# Patient Record
Sex: Female | Born: 1945
Health system: Southern US, Community
[De-identification: ages and names within clinical notes are randomized; demographics above are authoritative.]

## PROBLEM LIST (undated history)

## (undated) DIAGNOSIS — I10 Essential (primary) hypertension: Secondary | ICD-10-CM

## (undated) DIAGNOSIS — M1990 Unspecified osteoarthritis, unspecified site: Secondary | ICD-10-CM

## (undated) DIAGNOSIS — K219 Gastro-esophageal reflux disease without esophagitis: Secondary | ICD-10-CM

## (undated) DIAGNOSIS — N200 Calculus of kidney: Secondary | ICD-10-CM

## (undated) DIAGNOSIS — M25511 Pain in right shoulder: Secondary | ICD-10-CM

## (undated) DIAGNOSIS — E7800 Pure hypercholesterolemia, unspecified: Secondary | ICD-10-CM

## (undated) DIAGNOSIS — R51 Headache: Secondary | ICD-10-CM

---

## 1997-12-03 ENCOUNTER — Other Ambulatory Visit: Admission: RE | Admit: 1997-12-03 | Discharge: 1997-12-03 | Payer: Self-pay | Admitting: Obstetrics & Gynecology

## 1999-10-12 ENCOUNTER — Encounter: Payer: Self-pay | Admitting: Obstetrics and Gynecology

## 1999-10-12 ENCOUNTER — Ambulatory Visit (HOSPITAL_COMMUNITY): Admission: RE | Admit: 1999-10-12 | Discharge: 1999-10-12 | Payer: Self-pay | Admitting: Obstetrics and Gynecology

## 2000-09-06 HISTORY — PX: OTHER SURGICAL HISTORY: SHX169

## 2000-12-15 ENCOUNTER — Other Ambulatory Visit: Admission: RE | Admit: 2000-12-15 | Discharge: 2000-12-15 | Payer: Self-pay | Admitting: Obstetrics and Gynecology

## 2001-12-18 ENCOUNTER — Other Ambulatory Visit: Admission: RE | Admit: 2001-12-18 | Discharge: 2001-12-18 | Payer: Self-pay | Admitting: Obstetrics and Gynecology

## 2002-03-19 ENCOUNTER — Ambulatory Visit (HOSPITAL_COMMUNITY): Admission: RE | Admit: 2002-03-19 | Discharge: 2002-03-19 | Payer: Self-pay | Admitting: *Deleted

## 2002-12-25 ENCOUNTER — Other Ambulatory Visit: Admission: RE | Admit: 2002-12-25 | Discharge: 2002-12-25 | Payer: Self-pay | Admitting: Obstetrics and Gynecology

## 2003-12-31 ENCOUNTER — Other Ambulatory Visit: Admission: RE | Admit: 2003-12-31 | Discharge: 2003-12-31 | Payer: Self-pay | Admitting: Obstetrics and Gynecology

## 2005-02-03 ENCOUNTER — Other Ambulatory Visit: Admission: RE | Admit: 2005-02-03 | Discharge: 2005-02-03 | Payer: Self-pay | Admitting: Obstetrics and Gynecology

## 2006-06-17 ENCOUNTER — Other Ambulatory Visit: Admission: RE | Admit: 2006-06-17 | Discharge: 2006-06-17 | Payer: Self-pay | Admitting: Obstetrics and Gynecology

## 2007-04-11 ENCOUNTER — Encounter (INDEPENDENT_AMBULATORY_CARE_PROVIDER_SITE_OTHER): Payer: Self-pay | Admitting: *Deleted

## 2007-04-11 ENCOUNTER — Ambulatory Visit (HOSPITAL_COMMUNITY): Admission: RE | Admit: 2007-04-11 | Discharge: 2007-04-11 | Payer: Self-pay | Admitting: *Deleted

## 2009-11-04 DIAGNOSIS — N200 Calculus of kidney: Secondary | ICD-10-CM

## 2009-11-04 HISTORY — DX: Calculus of kidney: N20.0

## 2009-11-18 ENCOUNTER — Emergency Department (HOSPITAL_COMMUNITY): Admission: EM | Admit: 2009-11-18 | Discharge: 2009-11-18 | Payer: Self-pay | Admitting: Emergency Medicine

## 2009-12-17 ENCOUNTER — Emergency Department (HOSPITAL_COMMUNITY): Admission: EM | Admit: 2009-12-17 | Discharge: 2009-12-17 | Payer: Self-pay | Admitting: Emergency Medicine

## 2010-11-25 LAB — URINALYSIS, ROUTINE W REFLEX MICROSCOPIC
Bilirubin Urine: NEGATIVE
Glucose, UA: NEGATIVE mg/dL
Ketones, ur: NEGATIVE mg/dL
Leukocytes, UA: NEGATIVE
Nitrite: NEGATIVE
Protein, ur: NEGATIVE mg/dL
Specific Gravity, Urine: 1.014 (ref 1.005–1.030)
Urobilinogen, UA: 0.2 mg/dL (ref 0.0–1.0)
pH: 7.5 (ref 5.0–8.0)

## 2010-11-25 LAB — CBC
HCT: 39.9 % (ref 36.0–46.0)
MCV: 92.7 fL (ref 78.0–100.0)
RBC: 4.3 MIL/uL (ref 3.87–5.11)
WBC: 7.9 10*3/uL (ref 4.0–10.5)

## 2010-11-25 LAB — BASIC METABOLIC PANEL
BUN: 9 mg/dL (ref 6–23)
CO2: 27 mEq/L (ref 19–32)
Calcium: 9.5 mg/dL (ref 8.4–10.5)
Chloride: 106 mEq/L (ref 96–112)
Creatinine, Ser: 1.04 mg/dL (ref 0.4–1.2)
GFR calc Af Amer: >60 mL/min (ref >60)
GFR calc non Af Amer: 54 mL/min — ABNORMAL LOW (ref >60)
Glucose, Bld: 109 mg/dL — ABNORMAL HIGH (ref 70–99)
Potassium: 3.4 mEq/L — ABNORMAL LOW (ref 3.5–5.1)
Sodium: 141 mEq/L (ref 135–145)

## 2010-11-25 LAB — DIFFERENTIAL
Eosinophils Absolute: 0.1 10*3/uL (ref 0.0–0.7)
Lymphocytes Relative: 23 % (ref 12–46)
Lymphs Abs: 1.8 10*3/uL (ref 0.7–4.0)
Monocytes Relative: 6 % (ref 3–12)
Neutrophils Relative %: 69 % (ref 43–77)

## 2010-11-25 LAB — URINE MICROSCOPIC-ADD ON

## 2010-11-30 LAB — URINALYSIS, ROUTINE W REFLEX MICROSCOPIC
Glucose, UA: NEGATIVE mg/dL
Protein, ur: NEGATIVE mg/dL
pH: 7.5 (ref 5.0–8.0)

## 2010-11-30 LAB — URINE MICROSCOPIC-ADD ON

## 2011-01-19 NOTE — Op Note (Signed)
NAMEINDIE, BOEHNE              ACCOUNT NO.:  [id number]   MEDICAL RECORD NO.:  [id number]          PATIENT TYPE:  AMB   LOCATION:  ENDO                         FACILITY:  [facility]   PHYSICIAN:  Georgiana Spinner, M.D.    DATE OF BIRTH:  December 15, 1945   DATE OF PROCEDURE:  04/11/2007  DATE OF DISCHARGE:                               OPERATIVE REPORT   PROCEDURE:  Colonoscopy.   ANESTHESIA:  Fentanyl 50 mcg, Versed 3 mg.   INDICATIONS:  Colon cancer screening.  Family history of colon polyps.   PROCEDURE:  With the patient mildly sedated in the left lateral  decubitus position, the Pentax videoscopic colonoscope was inserted in  the rectum and passed under direct vision with rolling of the patient  onto her back slightly.  We were able to reach the cecum.  This was  identified by ileocecal valve and appendiceal orifice both of which were  photographed.  From this point the colonoscope was slowly withdrawn  taking circumferential views of colonic mucosa stopping only in the  rectum which appeared normal on direct and retroflex view.  The  endoscope was straightened and withdrawn.  The patient's vital signs and  pulse oximeter remained stable.  The patient tolerated the procedure  well without apparent complications.   FINDINGS:  Negative exam.   PLAN:  Consider repeat examination in five years due to family history.           ______________________________  Georgiana Spinner, M.D.     GMO/MEDQ  D:  04/11/2007  T:  04/11/2007  Job:  [id number]

## 2011-01-19 NOTE — Op Note (Signed)
NAMEMARTRICE, [name]              ACCOUNT NO.:  [id number]   MEDICAL RECORD NO.:  [id number]          PATIENT TYPE:  AMB   LOCATION:  ENDO                         FACILITY:  [facility]   PHYSICIAN:  Georgiana Spinner, M.D.    DATE OF BIRTH:  08-07-1946   DATE OF PROCEDURE:  04/11/2007  DATE OF DISCHARGE:                               OPERATIVE REPORT   PROCEDURE:  Upper endoscopy.   INDICATIONS:  Gastroesophageal reflux disease.   ANESTHESIA:  Fentanyl 50 mcg, Versed 7 mg.   PROCEDURE:  With the patient mildly sedated in the left lateral  decubitus position the Pentax videoscopic endoscope was inserted and  passed under direct vision through the esophagus which appeared normal  into the stomach.  Fundus appeared erythematous with red stippling  changes that were photographed and biopsied.  We advanced to the body,  antrum, duodenal bulb, second portion duodenum all of which appeared  normal.  From this point the endoscope was slowly withdrawn taking  circumferential views of duodenal mucosa until the endoscope had been  pulled back in the stomach placed in retroflexion to view the stomach  from below.  The endoscope was straightened and withdrawn taking  circumferential views of the remaining gastric and esophageal mucosa.  The patient's vital signs, pulse oximeter remained stable.  The patient  tolerated procedure well without apparent complications.   FINDINGS:  Erythematous changes of the gastric fundus.  Loose wrap of  the GE junction around the endoscope indicating laxity of the lower  esophageal sphincter.  Otherwise unremarkable exam.   PLAN:  Await biopsy report.  The patient will call me for results and  follow-up with me as an outpatient.  Proceed to colonoscopy.           ______________________________  Georgiana Spinner, M.D.     GMO/MEDQ  D:  04/11/2007  T:  04/11/2007  Job:  [id number]

## 2011-01-22 NOTE — Procedures (Signed)
[facility]  Patient:    Emily, [name] Visit Number: [id number] MRN: [id number]          Service Type: END Location: ENDO Attending Physician:  Sabino Gasser Dictated by:   Sabino Gasser, M.D. Proc. Date: 03/19/02 Admit Date:  03/19/2002                             Procedure Report  PROCEDURE:  Colonoscopy.  INDICATIONS:  Colon cancer screening; family history of colon cancer.  ANESTHESIA:  Demerol 60 mg, Versed 6 mg.  DESCRIPTION OF PROCEDURE:  With the patient mildly sedated in the left lateral decubitus position, the Olympus videoscopic colonoscope was inserted in the rectum and passed under direct vision to the cecum -- identified by ileocecal valve and appendiceal orifice, both of which were photographed.  From this point the colonoscope was then slowly withdrawn, taking circumferential views of the entire colonic mucosa, stopping only in the rectum (which appeared normal in direct and retroflexed view).  The endoscope was straightened and withdrawn.  The  patients vital signs, pulse oximetry remained stable.  The patient tolerated the procedure well and with no apparent complications.  FINDINGS:  Negative colonoscopic examination to the cecum.  PLAN:  Because of family history, repeat examination in approximately five years. Dictated by:   Sabino Gasser, M.D. Attending Physician:  Sabino Gasser DD:  03/19/02 TD:  03/20/02 Job: [id number]

## 2011-09-14 ENCOUNTER — Other Ambulatory Visit: Payer: Self-pay | Admitting: Orthopedic Surgery

## 2011-10-26 NOTE — H&P (Signed)
Emily [name] DOB: 09-23-1945   Chief Complaint: Left Hip Pain  History of Present Illness The patient is a 66 year old female who comes in today for a preoperative History and Physical. The patient is scheduled for a left total hip arthroplasty to be performed by Dr. Gus Rankin. Aluisio, MD at [facility] on Monday November 15, 2011 . The patient is being followed for their left hip pain and osteoarthritis. Symptoms reported today include pain at night and stiffness. She had good days abd bad days but her hip overall has gotten worse. She is having decresaing moblity, more difficulty with shoes and socks, more painful getting in and out of car, more difficult with steps. She denies any popping or clicking but will occassional will get the sensation of it giving way. She gets morning stiffness but it will ease off with activity. She is a very active person and feels that this has slowed her down. She feels better if she keeps going because if she sits down for any length of time, the hip will stiffen up on her. Total hip arthroplasty scheduled in attempt to decrease pain and increase function. PCP: Dr. Juline Patch OB/GYN: Dr. Huel Cote  Past MedicalHistory Osteoarthritis, Hip (715.35) Osteoarthrosis NOS, pelvis/thigh (715.95). 05/07/2010 Gastroesophageal Reflux Disease Urinary Tract Infection. 1990s Kidney Stone. March 2011  Allergies SULFA. 05/12/2010   Family History Cancer. father. related to work (asbestos exposure). deceased age 21 Heart Disease. mother, sister and brother Myocardial Infarction. Mother. deceased age 50   Social History Tobacco use. Former smoker. never smoker Alcohol use. never consumed alcohol Children. 1 Current work status. retired Financial planner (Currently). no Drug/Alcohol Rehab (Previously). no Exercise. Exercises daily; does other Illicit drug use. no Living situation. live with spouse Marital  status. married Number of flights of stairs before winded. 4-5 Pain Contract. no Tobacco / smoke exposure. no Caregiver. SNF after surgery [facility]   Medication History Crestor (10MG  Tablet, Oral daily) Active. Tylenol Extra Strength (500MG  Tablet, Oral) Active. Pantoprazole Sodium (40MG  Tablet DR, Oral as needed) Active. (prn)   Past Surgical History No pertinent past surgical history   Diagnostic Studies History EKG. Normal. Oct 2012   Review of Systems General:Present- Night Sweats (Hot flashes). Not Present- Chills, Fever, Appetite Loss, Fatigue, Feeling sick, Weight Gain, Weight Loss and Memory Loss. Skin:Not Present- Hives, Itching, Rash, Skin Color Changes, Ulcer, Psoriasis, Change in Hair or Nails, Eczema and Lesions. HEENT:Not Present- Sensitivity to light, Hearing problems, Tinnitus, Nose Bleed, Headache, Double Vision, Visual Loss, Hearing Loss, Ringing in the Ears and Dentures. Neck:Not Present- Swollen Glands and Neck Mass. Respiratory:Not Present- Shortness of breath with exertion, Shortness of breath at rest, Allergies, Coughing up blood and Chronic Cough. Cardiovascular:Present- Swelling of Extremities. Not Present- Shortness of Breath, Chest Pain, Racing/skipping heartbeats, Leg Cramps, Difficulty Breathing Lying Down, Murmur, Swelling and Palpitations. Gastrointestinal:Present- Heartburn. Not Present- Bloody Stool, Abdominal Pain, Vomiting, Nausea, Constipation, Diarrhea, Difficulty Swallowing, Incontinence of Stool, Jaundice and Loss of appetitie. Female Genitourinary:Present- Nocturia and Urgency. Not Present- Blood in Urine, Urinary frequency, Weak urinary stream, Discharge, Flank Pain, Incontinence, Painful Urination, Urinary Retention and Urinating at Night. Musculoskeletal:Present- Joint Pain and Morning Stiffness. Not Present- Muscle Weakness, Muscle Pain, Joint Stiffness, Joint Swelling, Back Pain and Spasms. Neurological:Not  Present- Tingling, Numbness, Burning, Tremor, Headaches, Dizziness, Blackout spells, Paralysis, Difficulty with balance and Weakness. Psychiatric:Not Present- Anxiety, Depression, Memory Loss and Insomnia. Endocrine:Not Present- Cold Intolerance, Heat Intolerance, Excessive hunger and Excessive Thirst. Hematology:Not Present- Abnormal Bleeding,  Anemia, Blood Clots and Easy Bruising.   Vitals 10/26/2011 2:18 PM Weight: 144 lb Height: 63 in Body Surface Area: 1.7 m Body Mass Index: 25.51 kg/m Pulse: 72 (Regular) Resp.: 18 (Unlabored) BP: 124/80 (Sitting, Left Arm, Standard)  Physical Exam General Mental Status - Alert, cooperative and good historian. General Appearance- pleasant. Not in acute distress. Orientation- Oriented X3. Build & Nutrition- Well nourished and Well developed. Head and Neck Head- normocephalic, atraumatic . Neck Global Assessment- supple. no bruit auscultated on the right and no bruit auscultated on the left. Eye Pupil- Bilateral- PERRLA. Motion- Bilateral- EOMI. Chest and Lung Exam Auscultation: Breath sounds:- clear at anterior chest wall and - clear at posterior chest wall. Adventitious sounds:- No Adventitious sounds. Cardiovascular Auscultation:Rhythm- Regular rate and rhythm. Heart Sounds- S1 WNL and S2 WNL. Murmurs & Other Heart Sounds:Auscultation of the heart reveals - No Murmurs. Abdomen Palpation/Percussion:Tenderness- Abdomen is non-tender to palpation. Rigidity (guarding)- Abdomen is soft. Auscultation:Auscultation of the abdomen reveals - Bowel sounds normal. Female Genitourinary Not done, not pertinent to present illness Peripheral Vascular Upper Extremity: Palpation:- Pulses bilaterally normal. Lower Extremity: Palpation:- Pulses bilaterally normal. Neurologic Examination of related systems reveals - normal muscle strength and tone in all extremities. Neurologic evaluation reveals -  normal sensation and upper and lower extremity deep tendon reflexes intact bilaterally  Musculoskeletal Left hip flex about 100, minimal internal rotation, 20 external rotation, 20 abduction. Right hip has normal range of motion without pain. Knees have normal painless ROM.  Assessment & Plan Osteoarthritis, Hip (715.35) Left Total Hip Arthroplasty   Dimitri Ped, PA-C

## 2011-11-04 ENCOUNTER — Encounter (HOSPITAL_COMMUNITY): Payer: Self-pay | Admitting: Pharmacy Technician

## 2011-11-08 ENCOUNTER — Ambulatory Visit (HOSPITAL_COMMUNITY)
Admission: RE | Admit: 2011-11-08 | Discharge: 2011-11-08 | Disposition: A | Discharge status: Home / Self Care | Payer: Medicare Other | Source: Ambulatory Visit | Attending: Orthopedic Surgery | Admitting: Orthopedic Surgery

## 2011-11-08 ENCOUNTER — Encounter (HOSPITAL_COMMUNITY): Payer: Self-pay

## 2011-11-08 ENCOUNTER — Encounter (HOSPITAL_COMMUNITY)
Admission: RE | Admit: 2011-11-08 | Discharge: 2011-11-08 | Disposition: A | Discharge status: Home / Self Care | Payer: Medicare Other | Source: Ambulatory Visit | Attending: Orthopedic Surgery | Admitting: Orthopedic Surgery

## 2011-11-08 DIAGNOSIS — Z01818 Encounter for other preprocedural examination: Secondary | ICD-10-CM | POA: Insufficient documentation

## 2011-11-08 DIAGNOSIS — Z01812 Encounter for preprocedural laboratory examination: Secondary | ICD-10-CM | POA: Insufficient documentation

## 2011-11-08 DIAGNOSIS — M169 Osteoarthritis of hip, unspecified: Secondary | ICD-10-CM | POA: Insufficient documentation

## 2011-11-08 DIAGNOSIS — M1610 Unilateral primary osteoarthritis, unspecified hip: Secondary | ICD-10-CM | POA: Insufficient documentation

## 2011-11-08 HISTORY — DX: Calculus of kidney: N20.0

## 2011-11-08 HISTORY — DX: Headache: R51

## 2011-11-08 HISTORY — DX: Pain in right shoulder: M25.511

## 2011-11-08 HISTORY — DX: Unspecified osteoarthritis, unspecified site: M19.90

## 2011-11-08 LAB — CBC
HCT: 44 % (ref 36.0–46.0)
MCH: 31 pg (ref 26.0–34.0)
MCHC: 34.8 g/dL (ref 30.0–36.0)
MCV: 89.2 fL (ref 78.0–100.0)
Platelets: 247 10*3/uL (ref 150–400)
RDW: 12.8 % (ref 11.5–15.5)
WBC: 5.8 10*3/uL (ref 4.0–10.5)

## 2011-11-08 LAB — COMPREHENSIVE METABOLIC PANEL
AST: 18 U/L (ref 0–37)
Albumin: 4.2 g/dL (ref 3.5–5.2)
BUN: 9 mg/dL (ref 6–23)
Calcium: 10.3 mg/dL (ref 8.4–10.5)
Creatinine, Ser: 0.79 mg/dL (ref 0.50–1.10)
Total Bilirubin: 0.5 mg/dL (ref 0.3–1.2)
Total Protein: 7.7 g/dL (ref 6.0–8.3)

## 2011-11-08 LAB — SURGICAL PCR SCREEN
MRSA, PCR: NEGATIVE
Staphylococcus aureus: NEGATIVE

## 2011-11-08 LAB — PROTIME-INR
INR: 0.98 (ref 0.00–1.49)
Prothrombin Time: 13.2 seconds (ref 11.6–15.2)

## 2011-11-08 LAB — URINALYSIS, ROUTINE W REFLEX MICROSCOPIC
Glucose, UA: NEGATIVE mg/dL
Hgb urine dipstick: NEGATIVE
Protein, ur: NEGATIVE mg/dL

## 2011-11-08 LAB — URINE MICROSCOPIC-ADD ON

## 2011-11-08 NOTE — Patient Instructions (Addendum)
20 Emily [name]  11/08/2011   Your procedure is scheduled on:  11-15-2011  Report to [facility] at  1020 AM.  Call this number if you have problems the morning of surgery: [phone]   Remember:   Do not eat food or drink liquids:After Midnight.  .  Take these medicines the morning of surgery with A SIP OF WATER: pantaprazole   Do not wear jewelry or make up.  Do not wear lotions, powders, or perfumes.Do not wear deodorant.    Do not bring valuables to the hospital.  Contacts, dentures or bridgework may not be worn into surgery.  Leave suitcase in the car. After surgery it may be brought to your room.  For patients admitted to the hospital, checkout time is 11:00 AM the day of discharge.     Special Instructions: CHG Shower Use Special Wash: 1/2 bottle night before surgery and 1/2 bottle morning of surgery.neck down avoid private area, no shaving 2 days before showers   Please read over the following fact sheets that you were given: MRSA Information, blood fact sheet  Cain Sieve [facility] pre op nurse phone number [phone], call if needed

## 2011-11-08 NOTE — Pre-Procedure Instructions (Addendum)
ekg 06-23-11 dr Ricki Miller on chart Stress test dr Jacinto Halim [phone] on chart

## 2011-11-08 NOTE — Pre-Procedure Instructions (Signed)
Dr Ricki Miller medical clearance note on chart

## 2011-11-15 ENCOUNTER — Encounter (HOSPITAL_COMMUNITY): Payer: Self-pay | Admitting: Anesthesiology

## 2011-11-15 ENCOUNTER — Encounter (HOSPITAL_COMMUNITY): Payer: Self-pay | Admitting: *Deleted

## 2011-11-15 ENCOUNTER — Inpatient Hospital Stay (HOSPITAL_COMMUNITY): Payer: Medicare Other

## 2011-11-15 ENCOUNTER — Encounter (HOSPITAL_COMMUNITY)
Admission: RE | Disposition: A | Discharge status: Skilled Nursing Facility | Payer: Self-pay | Source: Ambulatory Visit | Attending: Orthopedic Surgery

## 2011-11-15 ENCOUNTER — Inpatient Hospital Stay (HOSPITAL_COMMUNITY)
Admission: RE | Admit: 2011-11-15 | Discharge: 2011-11-18 | DRG: 470 | Disposition: A | Discharge status: Skilled Nursing Facility | Payer: Medicare Other | Source: Ambulatory Visit | Attending: Orthopedic Surgery | Admitting: Orthopedic Surgery

## 2011-11-15 ENCOUNTER — Inpatient Hospital Stay (HOSPITAL_COMMUNITY): Payer: Medicare Other | Admitting: Anesthesiology

## 2011-11-15 DIAGNOSIS — M169 Osteoarthritis of hip, unspecified: Principal | ICD-10-CM | POA: Diagnosis present

## 2011-11-15 DIAGNOSIS — Z96649 Presence of unspecified artificial hip joint: Secondary | ICD-10-CM

## 2011-11-15 DIAGNOSIS — E871 Hypo-osmolality and hyponatremia: Secondary | ICD-10-CM | POA: Diagnosis not present

## 2011-11-15 DIAGNOSIS — D62 Acute posthemorrhagic anemia: Secondary | ICD-10-CM | POA: Diagnosis not present

## 2011-11-15 DIAGNOSIS — E876 Hypokalemia: Secondary | ICD-10-CM | POA: Diagnosis not present

## 2011-11-15 DIAGNOSIS — M1610 Unilateral primary osteoarthritis, unspecified hip: Principal | ICD-10-CM | POA: Diagnosis present

## 2011-11-15 HISTORY — PX: TOTAL HIP ARTHROPLASTY: SHX124

## 2011-11-15 LAB — TYPE AND SCREEN: Antibody Screen: NEGATIVE

## 2011-11-15 SURGERY — ARTHROPLASTY, HIP, TOTAL,POSTERIOR APPROACH
Anesthesia: General | Site: Hip | Laterality: Left | Wound class: Clean

## 2011-11-15 MED ORDER — SODIUM CHLORIDE 0.9 % IV SOLN
INTRAVENOUS | Status: DC | PRN
  Administered 2011-11-15: 50 mL

## 2011-11-15 MED ORDER — PROPOFOL 10 MG/ML IV BOLUS
INTRAVENOUS | Status: DC | PRN
  Administered 2011-11-15: 120 mg via INTRAVENOUS

## 2011-11-15 MED ORDER — RIVAROXABAN 10 MG PO TABS
10.0000 mg | ORAL_TABLET | Freq: Every day | ORAL | Status: DC
  Administered 2011-11-16 – 2011-11-18 (×3): 10 mg via ORAL
  Filled 2011-11-15 (×3): qty 1

## 2011-11-15 MED ORDER — ACETAMINOPHEN 10 MG/ML IV SOLN
1000.0000 mg | Freq: Once | INTRAVENOUS | Status: AC
  Administered 2011-11-15: 1000 mg via INTRAVENOUS
  Filled 2011-11-15: qty 100

## 2011-11-15 MED ORDER — BISACODYL 10 MG RE SUPP: 10.0000 mg | Freq: Every day | RECTAL | Status: DC | PRN

## 2011-11-15 MED ORDER — PHENOL 1.4 % MT LIQD: 1.0000 | OROMUCOSAL | Status: DC | PRN

## 2011-11-15 MED ORDER — MORPHINE SULFATE 2 MG/ML IJ SOLN
INTRAMUSCULAR | Status: AC
  Filled 2011-11-15: qty 1

## 2011-11-15 MED ORDER — FENTANYL CITRATE 0.05 MG/ML IJ SOLN
INTRAMUSCULAR | Status: DC | PRN
  Administered 2011-11-15 (×2): 100 ug via INTRAVENOUS
  Administered 2011-11-15 (×3): 50 ug via INTRAVENOUS

## 2011-11-15 MED ORDER — CEFAZOLIN SODIUM 1-5 GM-% IV SOLN
1.0000 g | Freq: Once | INTRAVENOUS | Status: AC
  Administered 2011-11-15: 1 g via INTRAVENOUS

## 2011-11-15 MED ORDER — METOCLOPRAMIDE HCL 10 MG PO TABS: 5.0000 mg | ORAL_TABLET | Freq: Three times a day (TID) | ORAL | Status: DC | PRN

## 2011-11-15 MED ORDER — ONDANSETRON HCL 4 MG PO TABS
4.0000 mg | ORAL_TABLET | Freq: Four times a day (QID) | ORAL | Status: DC | PRN
  Administered 2011-11-16: 4 mg via ORAL
  Filled 2011-11-15: qty 1

## 2011-11-15 MED ORDER — BUPIVACAINE LIPOSOME 1.3 % IJ SUSP
20.0000 mL | Freq: Once | INTRAMUSCULAR | Status: DC
  Filled 2011-11-15: qty 20

## 2011-11-15 MED ORDER — HYDROMORPHONE HCL PF 1 MG/ML IJ SOLN
INTRAMUSCULAR | Status: DC | PRN
  Administered 2011-11-15 (×2): 0.5 mg via INTRAVENOUS
  Administered 2011-11-15: 1 mg via INTRAVENOUS

## 2011-11-15 MED ORDER — BUPIVACAINE LIPOSOME 1.3 % IJ SUSP
INTRAMUSCULAR | Status: DC | PRN
  Administered 2011-11-15: 20 mL

## 2011-11-15 MED ORDER — HYDROMORPHONE HCL PF 1 MG/ML IJ SOLN
INTRAMUSCULAR | Status: AC
  Filled 2011-11-15: qty 1

## 2011-11-15 MED ORDER — NEOSTIGMINE METHYLSULFATE 1 MG/ML IJ SOLN
INTRAMUSCULAR | Status: DC | PRN
  Administered 2011-11-15: 4 mg via INTRAVENOUS

## 2011-11-15 MED ORDER — DEXTROSE-NACL 5-0.45 % IV SOLN
INTRAVENOUS | Status: DC
  Administered 2011-11-15: 20:00:00 via INTRAVENOUS
  Administered 2011-11-16: 75 mL/h via INTRAVENOUS

## 2011-11-15 MED ORDER — DOCUSATE SODIUM 100 MG PO CAPS
100.0000 mg | ORAL_CAPSULE | Freq: Two times a day (BID) | ORAL | Status: DC
  Administered 2011-11-15 – 2011-11-18 (×6): 100 mg via ORAL
  Filled 2011-11-15 (×6): qty 1

## 2011-11-15 MED ORDER — ACETAMINOPHEN 650 MG RE SUPP: 650.0000 mg | Freq: Four times a day (QID) | RECTAL | Status: DC | PRN

## 2011-11-15 MED ORDER — DEXTROSE 5 % IV SOLN
500.0000 mg | Freq: Four times a day (QID) | INTRAVENOUS | Status: DC | PRN
  Administered 2011-11-15: 500 mg via INTRAVENOUS
  Filled 2011-11-15: qty 5

## 2011-11-15 MED ORDER — 0.9 % SODIUM CHLORIDE (POUR BTL) OPTIME
TOPICAL | Status: DC | PRN
  Administered 2011-11-15: 1000 mL

## 2011-11-15 MED ORDER — ONDANSETRON HCL 4 MG/2ML IJ SOLN
4.0000 mg | Freq: Four times a day (QID) | INTRAMUSCULAR | Status: DC | PRN
  Administered 2011-11-15 – 2011-11-17 (×2): 4 mg via INTRAVENOUS
  Filled 2011-11-15 (×3): qty 2

## 2011-11-15 MED ORDER — ACETAMINOPHEN 325 MG PO TABS: 650.0000 mg | ORAL_TABLET | Freq: Four times a day (QID) | ORAL | Status: DC | PRN

## 2011-11-15 MED ORDER — CEFAZOLIN SODIUM 1-5 GM-% IV SOLN
1.0000 g | Freq: Four times a day (QID) | INTRAVENOUS | Status: AC
  Administered 2011-11-15 – 2011-11-16 (×3): 1 g via INTRAVENOUS
  Filled 2011-11-15 (×3): qty 50

## 2011-11-15 MED ORDER — TEMAZEPAM 15 MG PO CAPS: 15.0000 mg | ORAL_CAPSULE | Freq: Every evening | ORAL | Status: DC | PRN

## 2011-11-15 MED ORDER — FLEET ENEMA 7-19 GM/118ML RE ENEM: 1.0000 | ENEMA | Freq: Once | RECTAL | Status: AC | PRN

## 2011-11-15 MED ORDER — DIPHENHYDRAMINE HCL 12.5 MG/5ML PO ELIX: 12.5000 mg | ORAL_SOLUTION | ORAL | Status: DC | PRN

## 2011-11-15 MED ORDER — METOCLOPRAMIDE HCL 5 MG/ML IJ SOLN
5.0000 mg | Freq: Three times a day (TID) | INTRAMUSCULAR | Status: DC | PRN
  Administered 2011-11-15: 10 mg via INTRAVENOUS
  Filled 2011-11-15: qty 2

## 2011-11-15 MED ORDER — ROCURONIUM BROMIDE 100 MG/10ML IV SOLN
INTRAVENOUS | Status: DC | PRN
  Administered 2011-11-15: 10 mg via INTRAVENOUS

## 2011-11-15 MED ORDER — MIDAZOLAM HCL 5 MG/5ML IJ SOLN
INTRAMUSCULAR | Status: DC | PRN
  Administered 2011-11-15: 2 mg via INTRAVENOUS

## 2011-11-15 MED ORDER — ATORVASTATIN CALCIUM 20 MG PO TABS
20.0000 mg | ORAL_TABLET | Freq: Every day | ORAL | Status: DC
  Administered 2011-11-15 – 2011-11-17 (×2): 20 mg via ORAL
  Filled 2011-11-15 (×4): qty 1

## 2011-11-15 MED ORDER — MORPHINE SULFATE 2 MG/ML IJ SOLN
1.0000 mg | INTRAMUSCULAR | Status: DC | PRN
  Administered 2011-11-15: 1 mg via INTRAVENOUS
  Administered 2011-11-16: 2 mg via INTRAVENOUS
  Filled 2011-11-15: qty 1

## 2011-11-15 MED ORDER — ACETAMINOPHEN 10 MG/ML IV SOLN
1000.0000 mg | Freq: Four times a day (QID) | INTRAVENOUS | Status: AC
  Administered 2011-11-15 – 2011-11-16 (×4): 1000 mg via INTRAVENOUS
  Filled 2011-11-15 (×4): qty 100

## 2011-11-15 MED ORDER — DEXAMETHASONE SODIUM PHOSPHATE 10 MG/ML IJ SOLN
10.0000 mg | Freq: Once | INTRAMUSCULAR | Status: AC
  Administered 2011-11-15: 10 mg via INTRAVENOUS

## 2011-11-15 MED ORDER — SODIUM CHLORIDE 0.9 % IV SOLN: INTRAVENOUS | Status: DC

## 2011-11-15 MED ORDER — METHOCARBAMOL 500 MG PO TABS
500.0000 mg | ORAL_TABLET | Freq: Four times a day (QID) | ORAL | Status: DC | PRN
  Administered 2011-11-16 – 2011-11-18 (×5): 500 mg via ORAL
  Filled 2011-11-15 (×5): qty 1

## 2011-11-15 MED ORDER — GLYCOPYRROLATE 0.2 MG/ML IJ SOLN
INTRAMUSCULAR | Status: DC | PRN
  Administered 2011-11-15: .5 mg via INTRAVENOUS

## 2011-11-15 MED ORDER — MENTHOL 3 MG MT LOZG
1.0000 | LOZENGE | OROMUCOSAL | Status: DC | PRN
  Filled 2011-11-15: qty 9

## 2011-11-15 MED ORDER — LACTATED RINGERS IV SOLN
INTRAVENOUS | Status: DC
  Administered 2011-11-15: 1000 mL via INTRAVENOUS

## 2011-11-15 MED ORDER — HYDROMORPHONE HCL PF 1 MG/ML IJ SOLN
0.2500 mg | INTRAMUSCULAR | Status: DC | PRN
  Administered 2011-11-15 (×2): 0.25 mg via INTRAVENOUS

## 2011-11-15 MED ORDER — SUCCINYLCHOLINE CHLORIDE 20 MG/ML IJ SOLN
INTRAMUSCULAR | Status: DC | PRN
  Administered 2011-11-15: 100 mg via INTRAVENOUS

## 2011-11-15 MED ORDER — ONDANSETRON HCL 4 MG/2ML IJ SOLN
INTRAMUSCULAR | Status: DC | PRN
  Administered 2011-11-15: 4 mg via INTRAVENOUS

## 2011-11-15 MED ORDER — OXYCODONE HCL 5 MG PO TABS
5.0000 mg | ORAL_TABLET | ORAL | Status: DC | PRN
  Administered 2011-11-15: 5 mg via ORAL
  Administered 2011-11-16 – 2011-11-17 (×7): 10 mg via ORAL
  Filled 2011-11-15 (×7): qty 2
  Filled 2011-11-15: qty 1

## 2011-11-15 MED ORDER — POLYETHYLENE GLYCOL 3350 17 G PO PACK
17.0000 g | PACK | Freq: Every day | ORAL | Status: DC | PRN
  Administered 2011-11-18: 17 g via ORAL
  Filled 2011-11-15: qty 1

## 2011-11-15 MED ORDER — PANTOPRAZOLE SODIUM 40 MG PO TBEC
40.0000 mg | DELAYED_RELEASE_TABLET | Freq: Every day | ORAL | Status: DC
  Administered 2011-11-16 – 2011-11-18 (×3): 40 mg via ORAL
  Filled 2011-11-15 (×3): qty 1

## 2011-11-15 SURGICAL SUPPLY — 51 items
BAG ZIPLOCK 12X15 (MISCELLANEOUS) ×2 IMPLANT
BIT DRILL 2.8X128 (BIT) ×2 IMPLANT
BLADE EXTENDED COATED 6.5IN (ELECTRODE) ×2 IMPLANT
BLADE SAW SAG 73X25 THK (BLADE) ×1
BLADE SAW SGTL 73X25 THK (BLADE) ×1 IMPLANT
CLOTH BEACON ORANGE TIMEOUT ST (SAFETY) ×2 IMPLANT
DECANTER SPIKE VIAL GLASS SM (MISCELLANEOUS) ×2 IMPLANT
DRAPE INCISE IOBAN 66X45 STRL (DRAPES) ×2 IMPLANT
DRAPE ORTHO SPLIT 77X108 STRL (DRAPES) ×2
DRAPE POUCH INSTRU U-SHP 10X18 (DRAPES) ×2 IMPLANT
DRAPE SURG ORHT 6 SPLT 77X108 (DRAPES) ×2 IMPLANT
DRAPE U-SHAPE 47X51 STRL (DRAPES) ×2 IMPLANT
DRSG ADAPTIC 3X8 NADH LF (GAUZE/BANDAGES/DRESSINGS) ×2 IMPLANT
DRSG MEPILEX BORDER 4X4 (GAUZE/BANDAGES/DRESSINGS) ×2 IMPLANT
DRSG MEPILEX BORDER 4X8 (GAUZE/BANDAGES/DRESSINGS) ×2 IMPLANT
DRSG MEPITEL 3X4 ME34 (GAUZE/BANDAGES/DRESSINGS) ×2 IMPLANT
DURAPREP 26ML APPLICATOR (WOUND CARE) ×2 IMPLANT
ELECT REM PT RETURN 9FT ADLT (ELECTROSURGICAL) ×2
ELECTRODE REM PT RTRN 9FT ADLT (ELECTROSURGICAL) ×1 IMPLANT
EVACUATOR 1/8 PVC DRAIN (DRAIN) ×2 IMPLANT
FACESHIELD LNG OPTICON STERILE (SAFETY) ×8 IMPLANT
GAUZE SPONGE 4X4 12PLY STRL LF (GAUZE/BANDAGES/DRESSINGS) ×2 IMPLANT
GLOVE BIO SURGEON STRL SZ7.5 (GLOVE) ×2 IMPLANT
GLOVE BIO SURGEON STRL SZ8 (GLOVE) ×2 IMPLANT
GLOVE BIOGEL PI IND STRL 8 (GLOVE) ×2 IMPLANT
GLOVE BIOGEL PI INDICATOR 8 (GLOVE) ×2
GOWN STRL NON-REIN LRG LVL3 (GOWN DISPOSABLE) ×2 IMPLANT
GOWN STRL REIN XL XLG (GOWN DISPOSABLE) ×2 IMPLANT
IMMOBILIZER KNEE 20 (SOFTGOODS) ×2
IMMOBILIZER KNEE 20 THIGH 36 (SOFTGOODS) ×1 IMPLANT
KIT BASIN OR (CUSTOM PROCEDURE TRAY) ×2 IMPLANT
MANIFOLD NEPTUNE II (INSTRUMENTS) ×2 IMPLANT
NDL SAFETY ECLIPSE 18X1.5 (NEEDLE) ×1 IMPLANT
NEEDLE HYPO 18GX1.5 SHARP (NEEDLE) ×1
NS IRRIG 1000ML POUR BTL (IV SOLUTION) ×2 IMPLANT
PACK TOTAL JOINT (CUSTOM PROCEDURE TRAY) ×2 IMPLANT
PASSER SUT SWANSON 36MM LOOP (INSTRUMENTS) ×2 IMPLANT
POSITIONER SURGICAL ARM (MISCELLANEOUS) ×2 IMPLANT
SPONGE GAUZE 4X4 12PLY (GAUZE/BANDAGES/DRESSINGS) ×2 IMPLANT
STRIP CLOSURE SKIN 1/2X4 (GAUZE/BANDAGES/DRESSINGS) ×4 IMPLANT
SUT ETHIBOND NAB CT1 #1 30IN (SUTURE) ×4 IMPLANT
SUT MNCRL AB 4-0 PS2 18 (SUTURE) ×2 IMPLANT
SUT VIC AB 1 CT1 27 (SUTURE) ×3
SUT VIC AB 1 CT1 27XBRD ANTBC (SUTURE) ×3 IMPLANT
SUT VIC AB 2-0 CT1 27 (SUTURE) ×3
SUT VIC AB 2-0 CT1 TAPERPNT 27 (SUTURE) ×3 IMPLANT
SYR 50ML LL SCALE MARK (SYRINGE) ×2 IMPLANT
TOWEL OR 17X26 10 PK STRL BLUE (TOWEL DISPOSABLE) ×4 IMPLANT
TOWEL OR NON WOVEN STRL DISP B (DISPOSABLE) ×2 IMPLANT
TRAY FOLEY CATH 14FRSI W/METER (CATHETERS) ×2 IMPLANT
WATER STERILE IRR 1500ML POUR (IV SOLUTION) ×2 IMPLANT

## 2011-11-15 NOTE — Op Note (Signed)
Pre-operative diagnosis- Osteoarthritis Left hip  Post-operative diagnosis- Osteoarthritis  Left hip  Procedure-  LeftTotal Hip Arthroplasty  Surgeon- Gus Rankin. Shanicqua Coldren, MD  Assistant- Avel Peace, PA-C   Anesthesia  General  EBL- 250   Drain hemovac   Complication- None  Condition-PACU - hemodynamically stable.   Brief Clinical Note-  Emily [name] is a 66 y.o. female with end stage arthritis of her left hip with progressively worsening pain and dysfunction. Pain occurs with activity and rest including pain at night. She has tried analgesics, protected weight bearing and rest without benefit. Pain is too severe to attempt physical therapy. Radiographs demonstrate bone on bone arthritis with subchondral cyst formation. She presents now for left THA.  Procedure in detail-   The patient is brought into the operating room and placed on the operating table. After successful administration of General  anesthesia, the patient is placed in the  Right lateral decubitus position with the  Left side up and held in place with the hip positioner. The lower extremity is isolated from the perineum with plastic drapes and time-out is performed by the surgical team. The lower extremity is then prepped and draped in the usual sterile fashion. A short posterolateral incision is made with a ten blade through the subcutaneous tissue to the level of the fascia lata which is incised in line with the skin incision. The sciatic nerve is palpated and protected and the short external rotators and capsule are isolated from the femur. The hip is then dislocated and the center of the femoral head is marked. A trial prosthesis is placed such that the trial head corresponds to the center of the patients' native femoral head. The resection level is marked on the femoral neck and the resection is made with an oscillating saw. The femoral head is removed and femoral retractors placed to gain access to the femoral canal.     The canal finder is passed into the femoral canal and the canal is thoroughly irrigated with sterile saline to remove the fatty contents. Axial reaming is performed to 15.5  mm, proximal reaming to 20D  and the sleeve machined to a small. A 20D small trial sleeve is placed into the proximal femur.      The femur is then retracted anteriorly to gain acetabular exposure. Acetabular retractors are placed and the labrum and osteophytes are removed, Acetabular reaming is performed to 51  mm and a 52  mm Pinnacle acetabular shell is placed in anatomic position with excellent purchase. Additional dome screws were not needed. An apex hole eliminator is placed and the permanent 32 mm neutral + 4 Marathon liner is placed into the acetabular shell.      The trial femur is then placed into the femoral canal. The size is 20 x 15  stem with a 36 + 8  neck and a 32 + 3 head with the neck version matching  the patients' native anteversion. The hip is reduced with excellent stability with full extension and full external rotation, 70 degrees flexion with 40 degrees adduction and 90 degrees internal rotation and 90 degrees of flexion with 70 degrees of internal rotation. The operative leg is placed on top of the non-operative leg and the leg lengths are found to be equal. The trials are then removed and the permanent implant of the same size is impacted into the femoral canal. The ceramic femoral head of the same size as the trial is placed and the hip is reduced  with the same stability parameters. The operative leg is again placed on top of the non-operative leg and the leg lengths are found to be equal.      The wound is then copiously irrigated with saline solution and the capsule and short external rotators are re-attached to the femur through drill holes with Ethibond suture. The fascia lata is closed over a hemovac drain with #1 vicryl suture and the fascia lata, gluteal muscles and subcutaneous tissues are injected with  Exparel 20ml diluted with saline 50ml. The subcutaneous tissues are closed with #1 and2-0 vicryl and the subcuticular layer closed with running 4-0 Monocryl. The drain is hooked to suction, incision cleaned and dried, and steri-srips and a bulky sterile dressing applied. The limb is placed into a knee immobilizer and the patient is awakened and transported to recovery in stable condition.      Please note that a surgical assistant was a medical necessity for this procedure in order to perform it in a safe and expeditious manner. The assistant was necessary to provide retraction to the vital neurovascular structures and to retract and position the limb to allow for anatomic placement of the prosthetic components.  Gus Rankin Emily Hungate, MD    11/15/2011, 2:13 PM

## 2011-11-15 NOTE — Anesthesia Preprocedure Evaluation (Addendum)
Anesthesia Evaluation  Patient identified by MRN, date of birth, ID band Patient awake    Reviewed: Allergy & Precautions, H&P , NPO status , Patient's Chart, lab work & pertinent test results, reviewed documented beta blocker date and time   Airway Mallampati: II      Dental  (+) Teeth Intact and Dental Advisory Given   Pulmonary neg pulmonary ROS,  breath sounds clear to auscultation        Cardiovascular negative cardio ROS  Rhythm:Regular Rate:Normal  Denies cardiac symptoms Given CV clearance   Neuro/Psych negative neurological ROS  negative psych ROS   GI/Hepatic negative GI ROS, Neg liver ROS,   Endo/Other  negative endocrine ROS  Renal/GU negative Renal ROS  negative genitourinary   Musculoskeletal   Abdominal   Peds negative pediatric ROS (+)  Hematology negative hematology ROS (+)   Anesthesia Other Findings   Reproductive/Obstetrics negative OB ROS                          Anesthesia Physical Anesthesia Plan  ASA: I  Anesthesia Plan: General   Post-op Pain Management:    Induction: Intravenous  Airway Management Planned: Oral ETT  Additional Equipment:   Intra-op Plan:   Post-operative Plan: Extubation in OR  Informed Consent: I have reviewed the patients History and Physical, chart, labs and discussed the procedure including the risks, benefits and alternatives for the proposed anesthesia with the patient or authorized representative who has indicated his/her understanding and acceptance.   Dental advisory given  Plan Discussed with: CRNA and Surgeon  Anesthesia Plan Comments:         Anesthesia Quick Evaluation

## 2011-11-15 NOTE — Preoperative (Signed)
Beta Blockers   Reason not to administer Beta Blockers:Not Applicable 

## 2011-11-15 NOTE — Interval H&P Note (Signed)
History and Physical Interval Note:  11/15/2011 12:42 PM  Emily [name]  has presented today for surgery, with the diagnosis of osteoarthritis left hip  The various methods of treatment have been discussed with the patient and family. After consideration of risks, benefits and other options for treatment, the patient has consented to  Procedure(s) (LRB): TOTAL HIP ARTHROPLASTY (Left) as a surgical intervention .  The patients' history has been reviewed, patient examined, no change in status, stable for surgery.  I have reviewed the patients' chart and labs.  Questions were answered to the patient's satisfaction.     Loanne Drilling

## 2011-11-15 NOTE — Transfer of Care (Signed)
Immediate Anesthesia Transfer of Care Note  Patient: Emily [name]  Procedure(s) Performed: Procedure(s) (LRB): TOTAL HIP ARTHROPLASTY (Left)  Patient Location: PACU  Anesthesia Type: General  Level of Consciousness: sedated, patient cooperative and responds to stimulaton  Airway & Oxygen Therapy: Patient Spontanous Breathing and Patient connected to face mask oxgen  Post-op Assessment: Report given to PACU RN and Post -op Vital signs reviewed and stable  Post vital signs: Reviewed and stable  Complications: No apparent anesthesia complications

## 2011-11-15 NOTE — Anesthesia Postprocedure Evaluation (Signed)
  Anesthesia Post-op Note  Patient: Emily [name]  Procedure(s) Performed: Procedure(s) (LRB): TOTAL HIP ARTHROPLASTY (Left)  Patient Location: PACU  Anesthesia Type: General  Level of Consciousness: oriented and sedated  Airway and Oxygen Therapy: Patient Spontanous Breathing and Patient connected to nasal cannula oxygen  Post-op Pain: mild  Post-op Assessment: Post-op Vital signs reviewed, Patient's Cardiovascular Status Stable, Respiratory Function Stable and Patent Airway  Post-op Vital Signs: stable  Complications: No apparent anesthesia complications

## 2011-11-16 LAB — CBC
Hemoglobin: 11.6 g/dL — ABNORMAL LOW (ref 12.0–15.0)
Platelets: 210 10*3/uL (ref 150–400)
RBC: 3.84 MIL/uL — ABNORMAL LOW (ref 3.87–5.11)

## 2011-11-16 LAB — BASIC METABOLIC PANEL
CO2: 24 mEq/L (ref 19–32)
GFR calc non Af Amer: >90 mL/min (ref >90)
Glucose, Bld: 152 mg/dL — ABNORMAL HIGH (ref 70–99)
Potassium: 3.6 mEq/L (ref 3.5–5.1)
Sodium: 134 mEq/L — ABNORMAL LOW (ref 135–145)

## 2011-11-16 NOTE — Progress Notes (Signed)
CSW consulted to assist with d/c planning. Met with pt today to assist with SNF placement. Pt has made plans to have ST rehab at [facility] following hospitalization. Plan confirmed with SNF. FL2 in shadow chart for MD signature. CSW will follow to assist with D/C planning to SNF.

## 2011-11-16 NOTE — Progress Notes (Signed)
Subjective: 1 Day Post-Op Procedure(s) (LRB): TOTAL HIP ARTHROPLASTY (Left) Patient reports pain as mild.   Patient seen in rounds with Dr. Lequita Halt. Patient doing well. We will start therapy today. Plan is to go [facility] after hospital stay.  Objective: Vital signs in last 24 hours: Temp:  [96.1 F (35.6 C)-98.2 F (36.8 C)] 98.2 F (36.8 C) (03/12 0538) Pulse Rate:  [63-86] 77  (03/12 0538) Resp:  [10-20] 16  (03/12 0538) BP: (114-160)/(61-99) 114/77 mmHg (03/12 0538) SpO2:  [96 %-100 %] 98 % (03/12 0538) Weight:  [64.411 kg (142 lb)] 64.411 kg (142 lb) (03/11 1723)  Intake/Output from previous day:  Intake/Output Summary (Last 24 hours) at 11/16/11 0902 Last data filed at 11/16/11 0844  Gross per 24 hour  Intake 4862.92 ml  Output   2460 ml  Net 2402.92 ml    Intake/Output this shift: UOP 1050  Labs:  [facility] 11/16/11 0431  HGB 11.6*    Basename 11/16/11 0431  WBC 11.7*  RBC 3.84*  HCT 33.6*  PLT 210    Basename 11/16/11 0431  NA 134*  K 3.6  CL 100  CO2 24  BUN 7  CREATININE 0.62  GLUCOSE 152*  CALCIUM 8.8   No results found for this basename: LABPT:2,INR:2 in the last 72 hours  Exam - Neurovascular intact Sensation intact distally Dressing - clean, dry, no drainage Motor function intact - moving foot and toes well on exam.  Hemovac pulled without difficulty.  Past Medical History  Diagnosis Date  . Headache   . Arthritis   . Kidney stone 12/09/2009   passed on own  . Right shoulder pain     at night    Assessment/Plan: 1 Day Post-Op Procedure(s) (LRB): TOTAL HIP ARTHROPLASTY (Left) Principal Problem:  *Osteoarthritis of hip   Advance diet Up with therapy Continue foley due to strict I&O and urinary output monitoring Discharge to SNF  DVT Prophylaxis - Xarelto  Protocol Partial-Weight Bearing 25-50% left Leg D/C Knee Immobilizer Hemovac Pulled Begin Therapy Hip Preacutions Keep foley until tomorrow. No  vaccines.  Khalfani Weideman 11/16/2011, 9:02 AM

## 2011-11-16 NOTE — Progress Notes (Signed)
Physical Therapy Treatment Patient Details Name: Emily [name] MRN: [id number] DOB: 16-May-1946 Today's Date: 11/16/2011 1425-1455 PT Assessment/Plan  PT - Assessment/Plan Comments on Treatment Session: pt progressing w/ mobility, c/o soreness, no real pain of L hip PT Plan: Discharge plan remains appropriate PT Frequency: 7X/week Recommendations for Other Services: OT consult Follow Up Recommendations: Skilled nursing facility Equipment Recommended: None recommended by PT PT Goals  Acute Rehab PT Goals PT Goal Formulation: With patient Time For Goal Achievement: 7 days Pt will go Supine/Side to Sit: with supervision PT Goal: Supine/Side to Sit - Progress: Goal set today Pt will go Sit to Supine/Side: with supervision PT Goal: Sit to Supine/Side - Progress: Progressing toward goal Pt will go Sit to Stand: with supervision PT Goal: Sit to Stand - Progress: Progressing toward goal Pt will go Stand to Sit: with supervision PT Goal: Stand to Sit - Progress: Progressing toward goal Pt will Stand: with supervision PT Goal: Stand - Progress: Progressing toward goal Pt will Ambulate: 51 - 150 feet;with supervision;with rolling walker PT Goal: Ambulate - Progress: Progressing toward goal Pt will Perform Home Exercise Program: with supervision, verbal cues required/provided PT Goal: Perform Home Exercise Program - Progress: Goal set today Additional Goals Additional Goal #1: demo post. THA precautions PT Goal: Additional Goal #1 - Progress: Progressing toward goal  PT Treatment Precautions/Restrictions  Precautions Precautions: Posterior Hip Restrictions Weight Bearing Restrictions: Yes LLE Weight Bearing: Partial weight bearing LLE Partial Weight Bearing Percentage or Pounds: 25-50 Mobility (including Balance) Bed Mobility Bed Mobility: Yes  Sit to Supine: 3: Mod assist;With rail;HOB flat, assist LLE onto bed, vc on technique Transfers Transfers: Yes Sit to Stand: 4: Min  assist;From chair/3-in-1;With armrests Sit to Stand Details (indicate cue type and reason): vc push up from chair. Stand to Sit: To bed;4: Min assist;With upper extremity assist Stand to Sit Details: vc for reaching back Ambulation/Gait Ambulation/Gait: Yes Ambulation/Gait Assistance: 4: Min assist Ambulation/Gait Assistance Details (indicate cue type and reason): vc for sequence Ambulation Distance (Feet): 25 Feet Assistive device: Rolling walker Gait Pattern: Step-to pattern    Exercise   End of Session PT - End of Session Activity Tolerance: Patient tolerated treatment well Patient left: in bed;with call bell in reach Nurse Communication: Mobility status for transfers General Behavior During Session: [facility] for tasks performed Cognition: [facility] for tasks performed  Rada Hay 11/16/2011, 4:37 PM

## 2011-11-16 NOTE — Progress Notes (Signed)
CARE MANAGEMENT NOTE 11/16/2011  Patient:  Emily [name], Emily [name]   Account Number:  [id number]  Date Initiated:  11/16/2011  Documentation initiated by:  Colleen Can  Subjective/Objective Assessment:   dx osteoarthritis left hip: total hip arthroplasty     Action/Plan:   CM spoke with patient. Pt states she wishes to go to SNF rehab upon discharge from hospital   Anticipated DC Date:  11/18/2011   Anticipated DC Plan:  SKILLED NURSING FACILITY  In-house referral  Clinical Social Worker      DC Planning Services  CM consult      [facility] Choice  NA   Choice offered to / List presented to:  NA   DME arranged  NA      DME agency  NA     HH arranged  NA      HH agency  NA   Status of service:  Completed, signed off Medicare Important Message given?  NA - LOS <3 / Initial given by admissions (If response is "NO", the following Medicare IM given date fields will be blank)

## 2011-11-16 NOTE — Plan of Care (Signed)
Problem: Consults Goal: Diagnosis- Total Joint Replacement Outcome: Progressing Primary Total Hip     

## 2011-11-16 NOTE — Evaluation (Signed)
Physical Therapy Evaluation Patient Details Name: Emily [name] MRN: [id number] DOB: [birth year] Today's Date: 11/16/2011  Problem List:  Patient Active Problem List  Diagnoses  . Osteoarthritis of hip    Past Medical History:  Past Medical History  Diagnosis Date  . Headache   . Arthritis   . Kidney stone 12-05-09   passed on own  . Right shoulder pain     at night   Past Surgical History:  Past Surgical History  Procedure Date  . Colonscopy 2002    PT Assessment/Plan/Recommendation PT Assessment Clinical Impression Statement: pt is s/p LTHA will benefit from PT to improve w/ rom, strength, mobility to dc to snf. PT Recommendation/Assessment: Patient will need skilled PT in the acute care venue PT Problem List: Decreased strength;Decreased range of motion;Decreased activity tolerance;Decreased mobility;Decreased knowledge of use of DME;Decreased safety awareness;Decreased knowledge of precautions;Pain PT Therapy Diagnosis : Difficulty walking;Acute pain PT Plan PT Frequency: 7X/week PT Treatment/Interventions: DME instruction;Gait training;Functional mobility training;Therapeutic activities;Patient/family education PT Recommendation Recommendations for Other Services: OT consult Follow Up Recommendations: Skilled nursing facility Equipment Recommended: None recommended by PT PT Goals  Acute Rehab PT Goals PT Goal Formulation: With patient Time For Goal Achievement: 7 days Pt will go Supine/Side to Sit: with supervision PT Goal: Supine/Side to Sit - Progress: Goal set today Pt will go Sit to Supine/Side: with supervision PT Goal: Sit to Supine/Side - Progress: Goal set today Pt will go Sit to Stand: with supervision PT Goal: Sit to Stand - Progress: Goal set today Pt will go Stand to Sit: with supervision PT Goal: Stand to Sit - Progress: Goal set today Pt will Stand: with supervision PT Goal: Stand - Progress: Goal set today Pt will Ambulate: 51 - 150  feet;with supervision;with rolling walker PT Goal: Ambulate - Progress: Goal set today Pt will Perform Home Exercise Program: with supervision, verbal cues required/provided PT Goal: Perform Home Exercise Program - Progress: Goal set today Additional Goals Additional Goal #1: demo post. THA precautions  PT Evaluation Precautions/Restrictions  Precautions Precautions: Posterior Hip Restrictions Weight Bearing Restrictions: Yes LLE Weight Bearing: Partial weight bearing LLE Partial Weight Bearing Percentage or Pounds: 25-50 Prior Functioning  Home Living Lives With: Alone Home Adaptive Equipment: None Prior Function Level of Independence: Independent with basic ADLs Driving: Yes Cognition Cognition Arousal/Alertness: Awake/alert Overall Cognitive Status: Appears within functional limits for tasks assessed Sensation/Coordination   Extremity Assessment RLE Assessment RLE Assessment: Within Functional Limits LLE Assessment LLE Assessment: Exceptions to WFL LLE AROM (degrees) LLE Overall AROM Comments: hip flex to 80 LLE Strength LLE Overall Strength Comments: hip flex with min assist Mobility (including Balance) Bed Mobility Bed Mobility: Yes Supine to Sit: 3: Mod assist;HOB elevated (Comment degrees);With rails Transfers Transfers: Yes Sit to Stand: 3: Mod assist;From elevated surface;From bed Sit to Stand Details (indicate cue type and reason): vc to push from bed and RW. Stand to Sit: 4: Min assist;With upper extremity assist;To chair/3-in-1 Stand to Sit Details: vc for reaching back Ambulation/Gait Ambulation/Gait: Yes Ambulation/Gait Assistance: 1: +2 Total assist Ambulation/Gait Assistance Details (indicate cue type and reason): vc for sequence/ PWB +1 for safety Ambulation Distance (Feet): 20 Feet Assistive device: Rolling walker Gait Pattern: Step-to pattern    Exercise  Total Joint Exercises Ankle Circles/Pumps: AROM;Both;15 reps Quad Sets: AROM;Left;10  reps;Supine Short Arc Quad: AROM;Left;10 reps;Supine Heel Slides: AAROM;Left;10 reps;Supine Hip ABduction/ADduction: AAROM;Left;10 reps;Supine End of Session PT - End of Session Activity Tolerance: Patient tolerated treatment well Patient left: in  chair;with call bell in reach;with family/visitor present Nurse Communication: Mobility status for transfers General Behavior During Session: [facility] for tasks performed Cognition: [facility] for tasks performed  Rada Hay 11/16/2011, 4:10 PM  [phone]

## 2011-11-16 NOTE — Progress Notes (Signed)
Utilization review completed.  

## 2011-11-17 LAB — BASIC METABOLIC PANEL
CO2: 27 mEq/L (ref 19–32)
Calcium: 9.1 mg/dL (ref 8.4–10.5)
Chloride: 97 mEq/L (ref 96–112)
Glucose, Bld: 118 mg/dL — ABNORMAL HIGH (ref 70–99)
Sodium: 130 mEq/L — ABNORMAL LOW (ref 135–145)

## 2011-11-17 LAB — CBC
Hemoglobin: 11 g/dL — ABNORMAL LOW (ref 12.0–15.0)
MCH: 30.8 pg (ref 26.0–34.0)
RBC: 3.57 MIL/uL — ABNORMAL LOW (ref 3.87–5.11)
WBC: 12.4 10*3/uL — ABNORMAL HIGH (ref 4.0–10.5)

## 2011-11-17 MED ORDER — TRAMADOL HCL 50 MG PO TABS: 50.0000 mg | ORAL_TABLET | Freq: Four times a day (QID) | ORAL | Status: DC | PRN

## 2011-11-17 MED ORDER — HYDROCODONE-ACETAMINOPHEN 5-325 MG PO TABS
1.0000 | ORAL_TABLET | ORAL | Status: DC | PRN
  Administered 2011-11-17: 1 via ORAL
  Administered 2011-11-18 (×2): 2 via ORAL
  Administered 2011-11-18: 1 via ORAL
  Filled 2011-11-17 (×2): qty 2
  Filled 2011-11-17 (×2): qty 1

## 2011-11-17 MED ORDER — POTASSIUM CHLORIDE CRYS ER 20 MEQ PO TBCR
40.0000 meq | EXTENDED_RELEASE_TABLET | Freq: Three times a day (TID) | ORAL | Status: AC
  Administered 2011-11-17 (×3): 40 meq via ORAL
  Filled 2011-11-17 (×3): qty 2

## 2011-11-17 NOTE — Evaluation (Signed)
Occupational Therapy Evaluation Patient Details Name: Emily [name] MRN: [id number] DOB: [birth year] Today's Date: 11/17/2011  Problem List:  Patient Active Problem List  Diagnoses  . Osteoarthritis of hip    Past Medical History:  Past Medical History  Diagnosis Date  . Headache   . Arthritis   . Kidney stone Dec 16, 2009   passed on own  . Right shoulder pain     at night   Past Surgical History:  Past Surgical History  Procedure Date  . Colonscopy 2002    OT Assessment/Plan/Recommendation OT Assessment Clinical Impression Statement: This 66 year old female underwent R THA with PWB and posterior THPS.  She was independent with ADLs prior to admission and is appropriate for skilled OT in acute. She was limited by nausea at time of eval.  Goals are min guard level in acute OT Recommendation/Assessment: Patient will need skilled OT in the acute care venue OT Problem List: Decreased strength;Decreased activity tolerance;Decreased knowledge of use of DME or AE;Decreased knowledge of precautions Barriers to Discharge: Decreased caregiver support OT Therapy Diagnosis : Generalized weakness OT Plan OT Frequency: Min 1X/week OT Treatment/Interventions: Self-care/ADL training;DME and/or AE instruction;Patient/family education OT Recommendation Follow Up Recommendations: Skilled nursing facility Equipment Recommended: Defer to next venue Individuals Consulted Consulted and Agree with Results and Recommendations: Patient OT Goals Acute Rehab OT Goals OT Goal Formulation: With patient Time For Goal Achievement: 7 days ADL Goals Pt Will Perform Grooming: with supervision;Standing at sink ADL Goal: Grooming - Progress: Goal set today Pt Will Perform Lower Body Bathing: with min assist;Sit to stand from chair;with adaptive equipment (min guard without cues for thps) ADL Goal: Lower Body Bathing - Progress: Goal set today Pt Will Perform Lower Body Dressing: with min assist;Sit to  stand from chair;with adaptive equipment (min guard without cues for thps) ADL Goal: Lower Body Dressing - Progress: Goal set today Pt Will Transfer to Toilet: with min assist;Ambulation;3-in-1;Maintaining weight bearing status;Maintaining hip precautions (min guard) ADL Goal: Toilet Transfer - Progress: Goal set today Pt Will Perform Toileting - Clothing Manipulation: with min assist;Standing (min guard) ADL Goal: Toileting - Clothing Manipulation - Progress: Goal set today Pt Will Perform Toileting - Hygiene: with min assist;Standing at 3-in-1/toilet (min guard) ADL Goal: Toileting - Hygiene - Progress: Goal set today  OT Evaluation Precautions/Restrictions  Precautions Precautions: Posterior Hip Restrictions Weight Bearing Restrictions: Yes LLE Weight Bearing: Partial weight bearing LLE Partial Weight Bearing Percentage or Pounds: 25-50 Prior Functioning Home Living Additional Comments: has purchased AE kit Prior Function Level of Independence: Independent with basic ADLs ADL ADL Grooming: Simulated;Set up Grooming Details (indicate cue type and reason): pt lightheaded and nauseaus with standing Where Assessed - Grooming: Sitting, chair;Supported Upper Body Bathing: Simulated;Set up Where Assessed - Upper Body Bathing: Sitting, chair;Supported Lower Body Bathing: Not assessed (has AE and has practiced:  LB NT due to nausea) Upper Body Dressing: Simulated;Set up Where Assessed - Upper Body Dressing: Sitting, chair;Supported Lower [employer] Dressing: Not assessed Toilet Transfer: Performed;Minimal Dentist Details (indicate cue type and reason): min vcs for technique Toilet Transfer Method: Stand pivot Acupuncturist: Bedside commode Toileting - Clothing Manipulation: Performed;Moderate assistance;Other (comment) (pants tight) Where Assessed - Toileting Clothing Manipulation: Standing Toileting - Hygiene: Performed;Minimal assistance Where Assessed -  Toileting Hygiene: Standing Equipment Used: Rolling walker Ambulation Related to ADLs: unable:  sitting BP 409/81--XBJYN not get in standing ADL Comments: has AE kit.  Session limited by nausea Vision/Perception  Vision - History Baseline  Vision: No visual deficits Cognition Cognition Overall Cognitive Status: Appears within functional limits for tasks assessed Orientation Level: Oriented X4 Sensation/Coordination   Extremity Assessment RUE Assessment RUE Assessment: Within Functional Limits LUE Assessment LUE Assessment: Within Functional Limits Mobility  Transfers Sit to Stand: 4: Min assist;From chair/3-in-1;With armrests Sit to Stand Details (indicate cue type and reason): min cues for hand placement Exercises   End of Session OT - End of Session Activity Tolerance: Other (comment) (limited by nausea) Patient left: in chair;with call bell in reach;with family/visitor present Nurse Communication: Other (comment) (nausea) General Behavior During Session: [facility] for tasks performed Cognition: [facility] for tasks performed [facility], OTR/L [phone] 11/17/2011  Emily [name] 11/17/2011, 12:01 PM

## 2011-11-17 NOTE — Progress Notes (Signed)
Subjective: 2 Days Post-Op Procedure(s) (LRB): TOTAL HIP ARTHROPLASTY (Left) Patient reports pain as mild.   Patient seen in rounds with Dr. Lequita Halt. Patient is doing OK.  Continue therapy.  Objective: Vital signs in last 24 hours: Temp:  [97.7 F (36.5 C)-98.8 F (37.1 C)] 98.8 F (37.1 C) (03/13 0500) Pulse Rate:  [69-86] 86  (03/13 0500) Resp:  [16] 16  (03/13 0500) BP: (132-147)/(70-80) 134/74 mmHg (03/13 0500) SpO2:  [95 %-100 %] 95 % (03/13 0500)  Intake/Output from previous day:  Intake/Output Summary (Last 24 hours) at 11/17/11 0818 Last data filed at 11/17/11 0640  Gross per 24 hour  Intake   2068 ml  Output   2035 ml  Net     33 ml    Intake/Output this shift:    Labs:  Basename 11/17/11 0424 11/16/11 0431  HGB 11.0* 11.6*    Basename 11/17/11 0424 11/16/11 0431  WBC 12.4* 11.7*  RBC 3.57* 3.84*  HCT 31.4* 33.6*  PLT 196 210    Basename 11/17/11 0424 11/16/11 0431  NA 130* 134*  K 3.1* 3.6  CL 97 100  CO2 27 24  BUN 7 7  CREATININE 0.69 0.62  GLUCOSE 118* 152*  CALCIUM 9.1 8.8   No results found for this basename: LABPT:2,INR:2 in the last 72 hours  Exam - Neurovascular intact Sensation intact distally Dressing/Incision - clean, dry, no drainage Motor function intact - moving foot and toes well on exam.   Past Medical History  Diagnosis Date  . Headache   . Arthritis   . Kidney stone 12-21-09   passed on own  . Right shoulder pain     at night    Assessment/Plan: 2 Days Post-Op Procedure(s) (LRB): TOTAL HIP ARTHROPLASTY (Left) Principal Problem:  *Osteoarthritis of hip   Advance diet Up with therapy  DVT Prophylaxis - Xarelto  Protocol Partial-Weight Bearing 25-50% left Leg  Jayden Rudge 11/17/2011, 8:18 AM

## 2011-11-17 NOTE — Progress Notes (Signed)
Physical Therapy Treatment Patient Details Name: Emily [name] MRN: [id number] DOB: 1945-10-13 Today's Date: 11/17/2011 1130-1145 PT 1145Assessment/Plan  PT - Assessment/Plan Comments on Treatment Session: pt w/ nausea after working w/ PT. rn gave IV nausea meds. pt to amb later. PT Plan: Discharge plan remains appropriate PT Frequency: 7X/week Equipment Recommended: Defer to next venue PT Goals  Acute Rehab PT Goals Pt will go Sit to Stand: with supervision PT Goal: Sit to Stand - Progress: Progressing toward goal Pt will go Stand to Sit: with supervision PT Goal: Stand to Sit - Progress: Progressing toward goal  PT Treatment Precautions/Restrictions  Precautions Precautions: Posterior Hip Restrictions Weight Bearing Restrictions: Yes LLE Weight Bearing: Partial weight bearing LLE Partial Weight Bearing Percentage or Pounds: 25-50 Mobility (including Balance) Transfers Sit to Stand: 4: Min assist;From chair/3-in-1;With armrests Sit to Stand Details (indicate cue type and reason): min cues for hand placement Stand to Sit: 4: Min assist;To chair/3-in-1;With upper extremity assist Stand to Sit Details: vc for reaching back Ambulation/Gait Ambulation/Gait: No Ambulation/Gait Assistance: 4: Min assist Ambulation/Gait Assistance Details (indicate cue type and reason): pt felt nausea and did not want to amb at this time. Assistive device: Rolling walker    Exercise    End of Session PT - End of Session Activity Tolerance:  (limited by nausea) Patient left: in chair Nurse Communication:  (nausea) General Behavior During Session: [facility] for tasks performed Cognition: [facility] for tasks performed  Rada Hay 11/17/2011, 3:33 PM

## 2011-11-17 NOTE — Progress Notes (Signed)
Physical Therapy Treatment Patient Details Name: Emily [name] MRN: [id number] DOB: 1946/03/30 Today's Date: 11/17/2011 1410-1435 PT Assessment/Plan  PT - Assessment/Plan Comments on Treatment Session: pt feeling much better PT Plan: Discharge plan remains appropriate PT Frequency: 7X/week Equipment Recommended: Defer to next venue PT Goals  Acute Rehab PT Goals Pt will go Sit to Stand: with supervision PT Goal: Sit to Stand - Progress: Progressing toward goal Pt will go Stand to Sit: with supervision PT Goal: Stand to Sit - Progress: Progressing toward goal Pt will Stand: with supervision PT Goal: Stand - Progress: Progressing toward goal Pt will Ambulate: 51 - 150 feet;with supervision;with rolling walker PT Goal: Ambulate - Progress: Progressing toward goal Pt will Perform Home Exercise Program: with supervision, verbal cues required/provided PT Goal: Perform Home Exercise Program - Progress: Progressing toward goal Additional Goals Additional Goal #1: demo post. THA precautions PT Goal: Additional Goal #1 - Progress: Progressing toward goal  PT Treatment Precautions/Restrictions  Precautions Precautions: Posterior Hip Restrictions Weight Bearing Restrictions: Yes LLE Weight Bearing: Partial weight bearing LLE Partial Weight Bearing Percentage or Pounds: 25-50 Mobility (including Balance) Transfers Sit to Stand: 4: Min assist;From chair/3-in-1;With armrests Sit to Stand Details (indicate cue type and reason): min cues for hand placement Stand to Sit: 5: Supervision;With upper extremity assist;To chair/3-in-1 Stand to Sit Details: vc for LLE placed forward. Ambulation/Gait Ambulation/Gait: No Ambulation/Gait Assistance: 4: Min assist Ambulation/Gait Assistance Details (indicate cue type and reason): vc for sequence,PWB, ER of LLE. Ambulation Distance (Feet): 50 Feet (20 ft fiirst) Assistive device: Rolling walker Gait Pattern: Step-to pattern Gait velocity: slow    Exercise  Total Joint Exercises Ankle Circles/Pumps: AROM;Both;15 reps Quad Sets: AROM;Left;10 reps;Supine Heel Slides: AAROM;Left;10 reps;Supine Hip ABduction/ADduction: AAROM;Left;10 reps;Supine Long Arc Quad: AROM;Left;10 reps;Seated End of Session PT - End of Session Activity Tolerance: Patient tolerated treatment well Patient left: in chair Nurse Communication: Mobility status for transfers General Behavior During Session: [facility] for tasks performed Cognition: [facility] for tasks performed  Rada Hay 11/17/2011, 3:39 PM

## 2011-11-17 NOTE — Progress Notes (Signed)
I ambulated patient to bathroom twice this shift. No denies pain while ambulating but does c/o of some soreness to left hip after activity. She does not want pain medication at this time, she states she okay. She was repositioned in her chair and and ice pack applied to hip.

## 2011-11-18 DIAGNOSIS — E876 Hypokalemia: Secondary | ICD-10-CM | POA: Diagnosis not present

## 2011-11-18 DIAGNOSIS — E871 Hypo-osmolality and hyponatremia: Secondary | ICD-10-CM | POA: Diagnosis not present

## 2011-11-18 DIAGNOSIS — D62 Acute posthemorrhagic anemia: Secondary | ICD-10-CM | POA: Diagnosis not present

## 2011-11-18 LAB — CBC
HCT: 30.2 % — ABNORMAL LOW (ref 36.0–46.0)
Hemoglobin: 10.5 g/dL — ABNORMAL LOW (ref 12.0–15.0)
MCH: 30.5 pg (ref 26.0–34.0)
MCV: 87.8 fL (ref 78.0–100.0)
Platelets: 201 10*3/uL (ref 150–400)
RBC: 3.44 MIL/uL — ABNORMAL LOW (ref 3.87–5.11)
WBC: 11.2 10*3/uL — ABNORMAL HIGH (ref 4.0–10.5)

## 2011-11-18 LAB — BASIC METABOLIC PANEL
Calcium: 9.3 mg/dL (ref 8.4–10.5)
Creatinine, Ser: 0.75 mg/dL (ref 0.50–1.10)
GFR calc Af Amer: >90 mL/min (ref >90)
GFR calc non Af Amer: 87 mL/min — ABNORMAL LOW (ref >90)
Sodium: 136 mEq/L (ref 135–145)

## 2011-11-18 MED ORDER — HYDROCODONE-ACETAMINOPHEN 5-325 MG PO TABS: 1.0000 | ORAL_TABLET | ORAL | Status: AC | PRN

## 2011-11-18 MED ORDER — DSS 100 MG PO CAPS: 100.0000 mg | ORAL_CAPSULE | Freq: Two times a day (BID) | ORAL | Status: AC

## 2011-11-18 MED ORDER — RIVAROXABAN 10 MG PO TABS: 10.0000 mg | ORAL_TABLET | Freq: Every day | ORAL | Status: DC

## 2011-11-18 MED ORDER — POLYETHYLENE GLYCOL 3350 17 G PO PACK: 17.0000 g | PACK | Freq: Every day | ORAL | Status: AC | PRN

## 2011-11-18 MED ORDER — ACETAMINOPHEN 325 MG PO TABS: 650.0000 mg | ORAL_TABLET | Freq: Four times a day (QID) | ORAL | Status: AC | PRN

## 2011-11-18 MED ORDER — METHOCARBAMOL 500 MG PO TABS: 500.0000 mg | ORAL_TABLET | Freq: Four times a day (QID) | ORAL | Status: AC | PRN

## 2011-11-18 NOTE — Discharge Summary (Signed)
Physician Discharge Summary   Patient ID: Emily [name] MRN: [id number] DOB/AGE: 1945/09/19 66 y.o.  Admit date: 11/15/2011 Discharge date: 11/18/2011  Primary Diagnosis: Osteoarthritis Left Hip  Admission Diagnoses: Past Medical History  Diagnosis Date  . Headache   . Arthritis   . Kidney stone 12-20-2009   passed on own  . Right shoulder pain     at night    Discharge Diagnoses:  Principal Problem:  *Osteoarthritis of hip Active Problems:  Postop Hyponatremia  Postop Hypokalemia  Postop Acute blood loss anemia   Procedure: Procedure(s) (LRB): TOTAL HIP ARTHROPLASTY (Left)   Consults: None  HPI: Emily [name] is a 66 y.o. female with end stage arthritis of her left hip with progressively worsening pain and dysfunction. Pain occurs with activity and rest including pain at night. She has tried analgesics, protected weight bearing and rest without benefit. Pain is too severe to attempt physical therapy. Radiographs demonstrate bone on bone arthritis with subchondral cyst formation. She presents now for left THA.  Laboratory Data: Hospital Outpatient Visit on 11/08/2011  Component Date Value Range Status  . aPTT (seconds) 11/08/2011 31  24-37 Final  . WBC (K/uL) 11/08/2011 5.8  4.0-10.5 Final  . RBC (MIL/uL) 11/08/2011 4.93  3.87-5.11 Final  . Hemoglobin (g/dL) 40/98/1191 47.8* 29.5-62.1 Final  . HCT (%) 11/08/2011 44.0  36.0-46.0 Final  . MCV (fL) 11/08/2011 89.2  78.0-100.0 Final  . MCH (pg) 11/08/2011 31.0  26.0-34.0 Final  . MCHC (g/dL) 30/86/5784 69.6  29.5-28.4 Final  . RDW (%) 11/08/2011 12.8  11.5-15.5 Final  . Platelets (K/uL) 11/08/2011 247  150-400 Final  . Sodium (mEq/L) 11/08/2011 139  135-145 Final  . Potassium (mEq/L) 11/08/2011 4.1  3.5-5.1 Final  . Chloride (mEq/L) 11/08/2011 104  96-112 Final  . CO2 (mEq/L) 11/08/2011 27  19-32 Final  . Glucose, Bld (mg/dL) 13/24/4010 98  27-25 Final  . BUN (mg/dL) 36/64/4034 9  7-42 Final  . Creatinine, Ser  (mg/dL) 59/56/3875 6.43  3.29-5.18 Final  . Calcium (mg/dL) 84/16/6063 01.6  0.1-09.3 Final  . Total Protein (g/dL) 23/55/7322 7.7  0.2-5.4 Final  . Albumin (g/dL) 27/02/2375 4.2  2.8-3.1 Final  . AST (U/L) 11/08/2011 18  0-37 Final  . ALT (U/L) 11/08/2011 14  0-35 Final  . Alkaline Phosphatase (U/L) 11/08/2011 71  39-117 Final  . Total Bilirubin (mg/dL) 51/76/1607 0.5  3.7-1.0 Final  . GFR calc non Af Amer (mL/min) 11/08/2011 86* >90 Final  . GFR calc Af Amer (mL/min) 11/08/2011 >90  >90 Final   Comment:                                 The eGFR has been calculated                          using the CKD EPI equation.                          This calculation has not been                          validated in all clinical                          situations.  eGFR's persistently                          <90 mL/min signify                          possible Chronic Kidney Disease.  Marland Kitchen Prothrombin Time (seconds) 11/08/2011 13.2  11.6-15.2 Final  . INR  11/08/2011 0.98  0.00-1.49 Final  . Color, Urine  11/08/2011 YELLOW  YELLOW Final  . APPearance  11/08/2011 CLEAR  CLEAR Final  . Specific Gravity, Urine  11/08/2011 1.020  1.005-1.030 Final  . pH  11/08/2011 6.5  5.0-8.0 Final  . Glucose, UA (mg/dL) 16/06/9603 NEGATIVE  NEGATIVE Final  . Hgb urine dipstick  11/08/2011 NEGATIVE  NEGATIVE Final  . Bilirubin Urine  11/08/2011 NEGATIVE  NEGATIVE Final  . Ketones, ur (mg/dL) 54/05/8118 NEGATIVE  NEGATIVE Final  . Protein, ur (mg/dL) 14/78/2956 NEGATIVE  NEGATIVE Final  . Urobilinogen, UA (mg/dL) 21/30/8657 0.2  8.4-6.9 Final  . Nitrite  11/08/2011 NEGATIVE  NEGATIVE Final  . Leukocytes, UA  11/08/2011 SMALL* NEGATIVE Final  . MRSA, PCR  11/08/2011 NEGATIVE  NEGATIVE Final  . Staphylococcus aureus  11/08/2011 NEGATIVE  NEGATIVE Final   Comment:                                 The Xpert SA Assay (FDA                          approved for NASAL specimens                           only), is one component of                          a comprehensive surveillance                          program.  It is not intended                          to diagnose infection nor to                          guide or monitor treatment.  . Squamous Epithelial / LPF  11/08/2011 FEW* RARE Final  . WBC, UA (WBC/hpf) 11/08/2011 3-6  <3 Final  . Bacteria, UA  11/08/2011 FEW* RARE Final  . Urine-Other  11/08/2011 MUCOUS PRESENT   Final    Basename 11/18/11 0443 11/17/11 0424 11/16/11 0431  HGB 10.5* 11.0* 11.6*    Basename 11/18/11 0443 11/17/11 0424  WBC 11.2* 12.4*  RBC 3.44* 3.57*  HCT 30.2* 31.4*  PLT 201 196    Basename 11/18/11 0443 11/17/11 0424  NA 136 130*  K 4.2 3.1*  CL 102 97  CO2 28 27  BUN 7 7  CREATININE 0.75 0.69  GLUCOSE 118* 118*  CALCIUM 9.3 9.1   No results found for this basename: LABPT:2,INR:2 in the last 72 hours  X-Rays: Chest 2 View  11/08/2011  *RADIOLOGY REPORT*  Clinical Data: Preoperative evaluation.  CHEST - 2 VIEW  Comparison: None.  Findings: The cardiac silhouette is normal size and shape.  Mediastinal and hilar contours appear normal.  No pulmonary nodules or infiltrates are evident. No pleural abnormality is evident. Bones appear average for age.  IMPRESSION: No acute or active cardiopulmonary or pleural abnormality is evident.  Original Report Authenticated By: Crawford Givens, M.D.   X-ray Hip Left Ap And Lateral  11/08/2011  *RADIOLOGY REPORT*  Clinical Data: Preoperative evaluation.  History of left hip pain. Left hip osteoarthritis.  Calcified uterine fibroid.  LEFT HIP - COMPLETE 2+ VIEW  Comparison: 03/04/2010.  Findings: Calcifications are seen in the right side of the pelvis. These are calcifications consistent with uterine leiomyoma calcifications.  There has been progression of the calcifications since prior examination.  There is severe narrowing of the left hip joint space.  There is marginal osteophyte formation and subcortical  cyst formation. These are findings associated with osteoarthritis.  No fracture bony destruction is seen.  No calcific bursitis is evident.  IMPRESSION: Calcifications of pelvis soft tissues consistent with calcifications associated with uterine leiomyoma formation. Calcifications are larger and more numerous than on prior study.  Severe osteoarthritic changes involving the left hip.  Original Report Authenticated By: Crawford Givens, M.D.   Dg Pelvis Portable  11/15/2011  *RADIOLOGY REPORT*  Clinical Data: Left total hip replacement.  PORTABLE PELVIS  Comparison: 03/04/2010  Findings: 1459 hours.  The patient is immediately status post left total hip replacement.  No findings to suggest immediate hardware complications.  Surgical drain overlies the soft tissues.  Gas in the soft tissues is compatible with immediate postoperative state.  IMPRESSION: Status post left total hip replacement without evidence for immediate hardware complications.  Original Report Authenticated By: ERIC A. MANSELL, M.D.   Dg Hip Portable 1 View Left  11/15/2011  *RADIOLOGY REPORT*  Clinical Data: Total hip replacement.  PORTABLE LEFT HIP - 1 VIEW  Comparison: 11/08/2011.  Findings: The patient is status post left total hip replacement.  A cross-table portable view of the left hip at 1506 hours is degraded by superimposed artifact.  However, the head of the femoral component can be visualized and is seen to be positioned within the acetabular cup.  IMPRESSION: Limited study secondary to artifact, but no evidence for immediate hardware complications.  Original Report Authenticated By: ERIC A. MANSELL, M.D.    EKG:No orders found for this or any previous visit.   Hospital Course: Patient was admitted to [facility] and taken to the OR and underwent the above state procedure without complications.  Patient tolerated the procedure well and was later transferred to the recovery room and then to the orthopaedic floor for  postoperative care.  They were given PO and IV analgesics for pain control following their surgery.  They were given 24 hours of postoperative antibiotics and started on DVT prophylaxis.   PT and OT were ordered for total joint protocol.  Discharge planning consulted to help with postop disposition and equipment needs.  Patient had a decent night on the evening of surgery and started to get up with therapy on day one. Hemovac drain was pulled without difficulty.  Continued to progress with therapy into day two walking over 50 feet.  Dressing was changed on day two and the incision was healing well.  By day three, the patient had progressed with therapy and meeting goals.  Incision was healing well.  Patient was seen in rounds and was ready for transfer over to [facility].   Discharge Medications: Prior to Admission medications   Medication Sig Start Date End Date  Taking? Authorizing Provider  pantoprazole (PROTONIX) 40 MG tablet Take 40 mg by mouth daily.   Yes Historical Provider, MD  acetaminophen (TYLENOL) 325 MG tablet Take 2 tablets (650 mg total) by mouth every 6 (six) hours as needed (or Fever >/= 101). 11/18/11 11/17/12  Keaundre Thelin, PA  docusate sodium 100 MG CAPS Take 100 mg by mouth 2 (two) times daily. 11/18/11 11/28/11  Gerard Bonus, PA  HYDROcodone-acetaminophen (NORCO) 5-325 MG per tablet Take 1-2 tablets by mouth every 4 (four) hours as needed. 11/18/11 11/28/11  Oliviarose Punch, PA  methocarbamol (ROBAXIN) 500 MG tablet Take 1 tablet (500 mg total) by mouth every 6 (six) hours as needed. 11/18/11 11/28/11  Melanee Cordial, PA  polyethylene glycol (MIRALAX / GLYCOLAX) packet Take 17 g by mouth daily as needed. 11/18/11 11/21/11  Idamae Coccia Julien Girt, PA  rivaroxaban (XARELTO) 10 MG TABS tablet Take 1 tablet (10 mg total) by mouth daily with breakfast. Take for two and a half more weeks and then discontinue. 11/18/11   Mayu Ronk Julien Girt, PA  rosuvastatin (CRESTOR) 10 MG  tablet Take 10 mg by mouth daily at 12 noon.    Historical Provider, MD    Diet: regular  Activity:PWB 25-50% No bending hip over 90 degrees- A "L" Angle Do not cross legs Do not let foot roll inward  When turning these patients a pillow should be placed between the patient's legs to prevent crossing.  Patients should have the affected knee fully extended when trying to sit or stand from all surfaces to prevent excessive hip flexion.  When ambulating and turning toward the affected side the affected leg should have the toes turned out prior to moving the walker and the rest of patient's body as to prevent internal rotation/ turning in of the leg.  Abduction pillows are the most effective way to prevent a patient from not crossing legs or turning toes in at rest. If an abduction pillow is not ordered placing a regular pillow length wise between the patient's legs is also an effective reminder.  It is imperative that these precautions be maintained so that the surgical hip does not dislocate.    Follow-up:in 2 weeks  Disposition: [facility]  Discharged Condition: good   Discharge Orders    Future Orders Please Complete By Expires   Diet - low sodium heart healthy      Call MD / Call 911      Comments:   If you experience chest pain or shortness of breath, CALL 911 and be transported to the hospital emergency room.  If you develope a fever above 101 F, pus (white drainage) or increased drainage or redness at the wound, or calf pain, call your surgeon's office.   Constipation Prevention      Comments:   Drink plenty of fluids.  Prune juice may be helpful.  You may use a stool softener, such as Colace (over the counter) 100 mg twice a day.  Use MiraLax (over the counter) for constipation as needed.   Increase activity slowly as tolerated      Weight Bearing as taught in Physical Therapy      Comments:   Use a walker or crutches as instructed.   Discharge instructions       Comments:   Pick up stool softner and laxative for home. Do not submerge incision under water. May shower. Continue to use ice for pain and swelling from surgery. Hip precautions.  Total Hip Protocol.    Driving restrictions  Comments:   No driving   Lifting restrictions      Comments:   No lifting   Follow the hip precautions as taught in Physical Therapy      Change dressing      Comments:   You may change your dressing daily with sterile 4 x 4 inch gauze dressing and paper tape.   TED hose      Comments:   Use stockings (TED hose) for 3 weeks on both leg(s).  You may remove them at night for sleeping.     Medication List  As of 11/18/2011  8:41 AM   STOP taking these medications         GOODYS BODY PAIN PO      naproxen sodium 220 MG tablet         TAKE these medications         acetaminophen 325 MG tablet   Commonly known as: TYLENOL   Take 2 tablets (650 mg total) by mouth every 6 (six) hours as needed (or Fever >/= 101).      DSS 100 MG Caps   Take 100 mg by mouth 2 (two) times daily.      HYDROcodone-acetaminophen 5-325 MG per tablet   Commonly known as: NORCO   Take 1-2 tablets by mouth every 4 (four) hours as needed.      methocarbamol 500 MG tablet   Commonly known as: ROBAXIN   Take 1 tablet (500 mg total) by mouth every 6 (six) hours as needed.      pantoprazole 40 MG tablet   Commonly known as: PROTONIX   Take 40 mg by mouth daily.      polyethylene glycol packet   Commonly known as: MIRALAX / GLYCOLAX   Take 17 g by mouth daily as needed.      rivaroxaban 10 MG Tabs tablet   Commonly known as: XARELTO   Take 1 tablet (10 mg total) by mouth daily with breakfast.      rosuvastatin 10 MG tablet   Commonly known as: CRESTOR   Take 10 mg by mouth daily at 12 noon.           Follow-up Information    Follow up with Loanne Drilling, MD. Schedule an appointment as soon as possible for a visit in 2 weeks.   Contact information:    [facility] [address] [place] [address] [phone]          Signed: Patrica Duel 11/18/2011, 8:41 AM

## 2011-11-18 NOTE — Progress Notes (Signed)
Patient OOB most of shift and noted ambulating with assistance to restroom. Spouse at bedside while discharge instructions and teaching done. Both vocalize understanding of instructions provided. She ambulated to stretcher and was placed on by transporter. She is discharging to SNF [facility]), left floor with no distress noted.

## 2011-11-18 NOTE — Discharge Instructions (Signed)
Hip Rehabilitation, Guidelines Following Surgery The results of a hip operation are greatly improved after range of motion and muscle strengthening exercises. Follow all safety measures which are given to protect your hip. If any of these exercises cause increased pain or swelling in your joint, decrease the amount until you are comfortable again. Then slowly increase the exercises. Call your caregiver if you have problems or questions. HOME CARE INSTRUCTIONS  Most of the following instructions are designed to prevent the dislocation of your new hip.  Do not put on socks or shoes without following the instructions of your caregivers.   Sit on high chairs so your hips are not bent more than 90 degrees.   Sit on chairs with arms. Use the chair arms to help push yourself up when arising.   Keep your leg on the side of the operation out in front of you when standing up.   Arrange for the use of a toilet seat elevator so you are not sitting low.   Do not do any exercises or get in any positions that cause your toes to point in (pigeon toed).   Always sleep with a pillow between your legs. Do not lie on your side in sleep with both knees touching the bed.   You may resume a sexual relationship in one month or when given the OK by your caregiver.   Use crutches or walker as long as suggested by your caregivers.   Begin weight bearing with your caregiver's approval.   Avoid periods of inactivity such as sitting longer than an hour when not asleep. This helps prevent blood clots.   Return to work as instructed by your caregiver.   Do not drive a car for 6 weeks or as instructed.   Do not drive while taking narcotics.   Wear elastic stockings until instructed not to.   Make sure you keep all of your appointments after your operation with all of your doctors and caregivers.  RANGE OF MOTION AND STRENGTHENING EXERCISES  These exercises are designed to help you keep full movement of your hip  joint. Follow your caregiver's or physical therapist's instructions. Perform all exercises about fifteen times, three times per day or as directed. Exercise both hips, even if you have had only one joint replacement. These exercises can be done on a training (exercise) mat, on the floor, on a table or on a bed. Use whatever works the best and is most comfortable for you. Use music or television while you are exercising so that the exercises are a pleasant break in your day. This will make your life better with the exercises acting as a break in routine you can look forward to..  Document Released: 03/26/2004 Document Revised: 08/12/2011 Document Reviewed: 03/13/2008 [employer] Patient Information 2012 [employer].  Pick up stool softner and laxative for home. Do not submerge incision under water. May shower. Continue to use ice for pain and swelling from surgery. Hip precautions.  Total Hip Protocol. 

## 2011-11-18 NOTE — Progress Notes (Signed)
Pt was d/c to [facility] today via [facility] transport for [facility] Re[facility].  Cori Razor  LCSW  [phone]

## 2011-11-18 NOTE — Progress Notes (Signed)
Physical Therapy Treatment Patient Details Name: Emily [name] MRN: [id number] DOB: 10/16/1945 Today's Date: 11/18/2011 1047-1103  PT Assessment/Plan  PT - Assessment/Plan Comments on Treatment Session: pt w/ less nause/ tolerating well. plans snf today/ PT Plan: Discharge plan remains appropriate Follow Up Recommendations: Skilled nursing facility PT Goals  Acute Rehab PT Goals Pt will go Sit to Stand: with supervision PT Goal: Sit to Stand - Progress: Progressing toward goal Pt will go Stand to Sit: with supervision PT Goal: Stand to Sit - Progress: Progressing toward goal Pt will Stand: with supervision PT Goal: Stand - Progress: Progressing toward goal Pt will Ambulate: 51 - 150 feet;with supervision;with rolling walker PT Goal: Ambulate - Progress: Progressing toward goal Pt will Perform Home Exercise Program: with supervision, verbal cues required/provided PT Goal: Perform Home Exercise Program - Progress: Progressing toward goal  PT Treatment Precautions/Restrictions  Precautions Precautions: Posterior Hip Restrictions Weight Bearing Restrictions: Yes LLE Weight Bearing: Partial weight bearing LLE Partial Weight Bearing Percentage or Pounds: 25-50 Mobility (including Balance) Transfers Sit to Stand: 5: Supervision;From chair/3-in-1;With armrests Sit to Stand Details (indicate cue type and reason): pt demo appropriate technique Stand to Sit: 5: Supervision;With armrests;To chair/3-in-1 Stand to Sit Details: pt. demod appropriate technique. Ambulation/Gait Ambulation/Gait Assistance: 5: Supervision Ambulation/Gait Assistance Details (indicate cue type and reason): vc for sequence,PWB, ER of LLE Ambulation Distance (Feet): 150 Feet Assistive device: Rolling walker Gait Pattern: Step-to pattern Gait velocity: slow    Exercise    End of Session PT - End of Session Activity Tolerance: Patient tolerated treatment well Patient left: in chair Nurse Communication:  Mobility status for transfers General Behavior During Session: [facility] for tasks performed Cognition: [facility] for tasks performed  Rada Hay 11/18/2011, 12:28 PM

## 2011-11-18 NOTE — Progress Notes (Signed)
Subjective: 3 Days Post-Op Procedure(s) (LRB): TOTAL HIP ARTHROPLASTY (Left) Patient reports pain as mild.   Patient seen in rounds with Dr. Lequita Halt. Patient doing well. Sitting on side of bed.  Ready for transfer to [facility].  Objective: Vital signs in last 24 hours: Temp:  [98.7 F (37.1 C)-99.7 F (37.6 C)] 98.7 F (37.1 C) (03/14 0715) Pulse Rate:  [87-108] 88  (03/14 0715) Resp:  [16-18] 16  (03/14 0715) BP: (126-136)/(79-83) 129/79 mmHg (03/14 0715) SpO2:  [99 %] 99 % (03/14 0715)  Intake/Output from previous day:  Intake/Output Summary (Last 24 hours) at 11/18/11 0834 Last data filed at 11/18/11 0715  Gross per 24 hour  Intake   1200 ml  Output   1350 ml  Net   -150 ml    Intake/Output this shift: Total I/O In: 240 [P.O.:240] Out: -   Labs: Results for orders placed during the hospital encounter of 11/15/11  TYPE AND SCREEN      Component Value Range   ABO/RH(D) A POS     Antibody Screen NEG     Sample Expiration 11/18/2011    ABO/RH      Component Value Range   ABO/RH(D) A POS    CBC      Component Value Range   WBC 11.7 (*) 4.0 - 10.5 (K/uL)   RBC 3.84 (*) 3.87 - 5.11 (MIL/uL)   Hemoglobin 11.6 (*) 12.0 - 15.0 (g/dL)   HCT 82.9 (*) 56.2 - 46.0 (%)   MCV 87.5  78.0 - 100.0 (fL)   MCH 30.2  26.0 - 34.0 (pg)   MCHC 34.5  30.0 - 36.0 (g/dL)   RDW 13.0  86.5 - 78.4 (%)   Platelets 210  150 - 400 (K/uL)  BASIC METABOLIC PANEL      Component Value Range   Sodium 134 (*) 135 - 145 (mEq/L)   Potassium 3.6  3.5 - 5.1 (mEq/L)   Chloride 100  96 - 112 (mEq/L)   CO2 24  19 - 32 (mEq/L)   Glucose, Bld 152 (*) 70 - 99 (mg/dL)   BUN 7  6 - 23 (mg/dL)   Creatinine, Ser 6.96  0.50 - 1.10 (mg/dL)   Calcium 8.8  8.4 - 29.5 (mg/dL)   GFR calc non Af Amer >90  >90 (mL/min)   GFR calc Af Amer >90  >90 (mL/min)  CBC      Component Value Range   WBC 12.4 (*) 4.0 - 10.5 (K/uL)   RBC 3.57 (*) 3.87 - 5.11 (MIL/uL)   Hemoglobin 11.0 (*) 12.0 - 15.0 (g/dL)   HCT 28.4  (*) 13.2 - 46.0 (%)   MCV 88.0  78.0 - 100.0 (fL)   MCH 30.8  26.0 - 34.0 (pg)   MCHC 35.0  30.0 - 36.0 (g/dL)   RDW 44.0  10.2 - 72.5 (%)   Platelets 196  150 - 400 (K/uL)  BASIC METABOLIC PANEL      Component Value Range   Sodium 130 (*) 135 - 145 (mEq/L)   Potassium 3.1 (*) 3.5 - 5.1 (mEq/L)   Chloride 97  96 - 112 (mEq/L)   CO2 27  19 - 32 (mEq/L)   Glucose, Bld 118 (*) 70 - 99 (mg/dL)   BUN 7  6 - 23 (mg/dL)   Creatinine, Ser 3.66  0.50 - 1.10 (mg/dL)   Calcium 9.1  8.4 - 44.0 (mg/dL)   GFR calc non Af Amer 89 (*) >90 (mL/min)   GFR calc  Af Amer >90  >90 (mL/min)  CBC      Component Value Range   WBC 11.2 (*) 4.0 - 10.5 (K/uL)   RBC 3.44 (*) 3.87 - 5.11 (MIL/uL)   Hemoglobin 10.5 (*) 12.0 - 15.0 (g/dL)   HCT 16.1 (*) 09.6 - 46.0 (%)   MCV 87.8  78.0 - 100.0 (fL)   MCH 30.5  26.0 - 34.0 (pg)   MCHC 34.8  30.0 - 36.0 (g/dL)   RDW 04.5  40.9 - 81.1 (%)   Platelets 201  150 - 400 (K/uL)  BASIC METABOLIC PANEL      Component Value Range   Sodium 136  135 - 145 (mEq/L)   Potassium 4.2  3.5 - 5.1 (mEq/L)   Chloride 102  96 - 112 (mEq/L)   CO2 28  19 - 32 (mEq/L)   Glucose, Bld 118 (*) 70 - 99 (mg/dL)   BUN 7  6 - 23 (mg/dL)   Creatinine, Ser 9.14  0.50 - 1.10 (mg/dL)   Calcium 9.3  8.4 - 78.2 (mg/dL)   GFR calc non Af Amer 87 (*) >90 (mL/min)   GFR calc Af Amer >90  >90 (mL/min)    Exam: Neurovascular intact Sensation intact distally Incision - clean, dry, no drainage Motor function intact - moving foot and toes well on exam.   Assessment/Plan: 3 Days Post-Op Procedure(s) (LRB): TOTAL HIP ARTHROPLASTY (Left) Procedure(s) (LRB): TOTAL HIP ARTHROPLASTY (Left) Past Medical History  Diagnosis Date  . Headache   . Arthritis   . Kidney stone December 17, 2009   passed on own  . Right shoulder pain     at night   Principal Problem:  *Osteoarthritis of hip Active Problems:  Postop Hyponatremia  Postop Hypokalemia  Postop Acute blood loss anemia   Up with  therapy Discharge to SNF Diet - regular Follow up - in 2 weeks Activity - PWB Condition Upon Discharge - Good D/C Meds - See DC Summary DVT Prophylaxis - Xarelto Protocol   Emily [name] 11/18/2011, 8:34 AM

## 2011-12-01 ENCOUNTER — Encounter (HOSPITAL_COMMUNITY): Payer: Self-pay | Admitting: Orthopedic Surgery

## 2012-07-25 ENCOUNTER — Other Ambulatory Visit: Payer: Self-pay | Admitting: Internal Medicine

## 2012-07-25 DIAGNOSIS — R1310 Dysphagia, unspecified: Secondary | ICD-10-CM

## 2012-08-02 ENCOUNTER — Ambulatory Visit
Admission: RE | Admit: 2012-08-02 | Discharge: 2012-08-02 | Disposition: A | Discharge status: Home / Self Care | Payer: Medicare Other | Source: Ambulatory Visit | Attending: Internal Medicine | Admitting: Internal Medicine

## 2012-08-02 DIAGNOSIS — R1310 Dysphagia, unspecified: Secondary | ICD-10-CM

## 2015-02-19 ENCOUNTER — Encounter (HOSPITAL_COMMUNITY): Payer: Self-pay | Admitting: Emergency Medicine

## 2015-02-19 DIAGNOSIS — K219 Gastro-esophageal reflux disease without esophagitis: Secondary | ICD-10-CM | POA: Insufficient documentation

## 2015-02-19 DIAGNOSIS — Z87442 Personal history of urinary calculi: Secondary | ICD-10-CM | POA: Insufficient documentation

## 2015-02-19 DIAGNOSIS — I159 Secondary hypertension, unspecified: Secondary | ICD-10-CM | POA: Diagnosis not present

## 2015-02-19 DIAGNOSIS — Z8739 Personal history of other diseases of the musculoskeletal system and connective tissue: Secondary | ICD-10-CM | POA: Diagnosis not present

## 2015-02-19 DIAGNOSIS — I10 Essential (primary) hypertension: Secondary | ICD-10-CM | POA: Diagnosis present

## 2015-02-19 DIAGNOSIS — E780 Pure hypercholesterolemia: Secondary | ICD-10-CM | POA: Insufficient documentation

## 2015-02-19 NOTE — ED Notes (Signed)
Pt. reports elevated blood pressure at home this evening 169/91 with mild headache and slight nausea.

## 2015-02-20 ENCOUNTER — Emergency Department (HOSPITAL_COMMUNITY)
Admission: EM | Admit: 2015-02-20 | Discharge: 2015-02-20 | Disposition: A | Discharge status: Home / Self Care | Payer: Medicare Other | Attending: Emergency Medicine | Admitting: Emergency Medicine

## 2015-02-20 DIAGNOSIS — I159 Secondary hypertension, unspecified: Secondary | ICD-10-CM

## 2015-02-20 HISTORY — DX: Gastro-esophageal reflux disease without esophagitis: K21.9

## 2015-02-20 HISTORY — DX: Pure hypercholesterolemia, unspecified: E78.00

## 2015-02-20 LAB — URINALYSIS, ROUTINE W REFLEX MICROSCOPIC
Bilirubin Urine: NEGATIVE
GLUCOSE, UA: NEGATIVE mg/dL
Hgb urine dipstick: NEGATIVE
Ketones, ur: NEGATIVE mg/dL
NITRITE: NEGATIVE
PH: 6 (ref 5.0–8.0)
Protein, ur: NEGATIVE mg/dL
SPECIFIC GRAVITY, URINE: 1.006 (ref 1.005–1.030)
Urobilinogen, UA: 0.2 mg/dL (ref 0.0–1.0)

## 2015-02-20 LAB — COMPREHENSIVE METABOLIC PANEL
ALBUMIN: 4 g/dL (ref 3.5–5.0)
ALT: 18 U/L (ref 14–54)
ANION GAP: 9 (ref 5–15)
AST: 23 U/L (ref 15–41)
Alkaline Phosphatase: 64 U/L (ref 38–126)
BUN: 9 mg/dL (ref 6–20)
CO2: 23 mmol/L (ref 22–32)
CREATININE: 0.87 mg/dL (ref 0.44–1.00)
Calcium: 9.7 mg/dL (ref 8.9–10.3)
Chloride: 107 mmol/L (ref 101–111)
GFR calc Af Amer: >60 mL/min (ref >60)
GFR calc non Af Amer: >60 mL/min (ref >60)
Glucose, Bld: 136 mg/dL — ABNORMAL HIGH (ref 65–99)
Potassium: 3.2 mmol/L — ABNORMAL LOW (ref 3.5–5.1)
Sodium: 139 mmol/L (ref 135–145)
TOTAL PROTEIN: 6.6 g/dL (ref 6.5–8.1)
Total Bilirubin: 0.5 mg/dL (ref 0.3–1.2)

## 2015-02-20 LAB — CBC WITH DIFFERENTIAL/PLATELET
BASOS ABS: 0.1 10*3/uL (ref 0.0–0.1)
BASOS PCT: 1 % (ref 0–1)
Eosinophils Absolute: 0.2 10*3/uL (ref 0.0–0.7)
Eosinophils Relative: 3 % (ref 0–5)
HCT: 41.6 % (ref 36.0–46.0)
Hemoglobin: 14.2 g/dL (ref 12.0–15.0)
LYMPHS PCT: 46 % (ref 12–46)
Lymphs Abs: 3.4 10*3/uL (ref 0.7–4.0)
MCH: 30.7 pg (ref 26.0–34.0)
MCHC: 34.1 g/dL (ref 30.0–36.0)
MCV: 89.8 fL (ref 78.0–100.0)
Monocytes Absolute: 0.5 10*3/uL (ref 0.1–1.0)
Monocytes Relative: 7 % (ref 3–12)
NEUTROS ABS: 3.1 10*3/uL (ref 1.7–7.7)
Neutrophils Relative %: 43 % (ref 43–77)
PLATELETS: 262 10*3/uL (ref 150–400)
RBC: 4.63 MIL/uL (ref 3.87–5.11)
RDW: 13.2 % (ref 11.5–15.5)
WBC: 7.3 10*3/uL (ref 4.0–10.5)

## 2015-02-20 LAB — URINE MICROSCOPIC-ADD ON

## 2015-02-20 NOTE — Discharge Instructions (Signed)
°Hypertension °Hypertension, commonly called high blood pressure, is when the force of blood pumping through your arteries is too strong. Your arteries are the blood vessels that carry blood from your heart throughout your body. A blood pressure reading consists of a higher number over a lower number, such as 110/72. The higher number (systolic) is the pressure inside your arteries when your heart pumps. The lower number (diastolic) is the pressure inside your arteries when your heart relaxes. Ideally you want your blood pressure below 120/80. °Hypertension forces your heart to work harder to pump blood. Your arteries may become narrow or stiff. Having hypertension puts you at risk for heart disease, stroke, and other problems.  °RISK FACTORS °Some risk factors for high blood pressure are controllable. Others are not.  °Risk factors you cannot control include:  °· Race. You may be at higher risk if you are African American. °· Age. Risk increases with age. °· Gender. Men are at higher risk than women before age 45 years. After age 65, women are at higher risk than men. °Risk factors you can control include: °· Not getting enough exercise or physical activity. °· Being overweight. °· Getting too much fat, sugar, calories, or salt in your diet. °· Drinking too much alcohol. °SIGNS AND SYMPTOMS °Hypertension does not usually cause signs or symptoms. Extremely high blood pressure (hypertensive crisis) may cause headache, anxiety, shortness of breath, and nosebleed. °DIAGNOSIS  °To check if you have hypertension, your health care provider will measure your blood pressure while you are seated, with your arm held at the level of your heart. It should be measured at least twice using the same arm. Certain conditions can cause a difference in blood pressure between your right and left arms. A blood pressure reading that is higher than normal on one occasion does not mean that you need treatment. If one blood pressure  reading is high, ask your health care provider about having it checked again. °TREATMENT  °Treating high blood pressure includes making lifestyle changes and possibly taking medicine. Living a healthy lifestyle can help lower high blood pressure. You may need to change some of your habits. °Lifestyle changes may include: °· Following the DASH diet. This diet is high in fruits, vegetables, and whole grains. It is low in salt, red meat, and added sugars. °· Getting at least 2½ hours of brisk physical activity every week. °· Losing weight if necessary. °· Not smoking. °· Limiting alcoholic beverages. °· Learning ways to reduce stress. ° If lifestyle changes are not enough to get your blood pressure under control, your health care provider may prescribe medicine. You may need to take more than one. Work closely with your health care provider to understand the risks and benefits. °HOME CARE INSTRUCTIONS °· Have your blood pressure rechecked as directed by your health care provider.   °· Take medicines only as directed by your health care provider. Follow the directions carefully. Blood pressure medicines must be taken as prescribed. The medicine does not work as well when you skip doses. Skipping doses also puts you at risk for problems.   °· Do not smoke.   °· Monitor your blood pressure at home as directed by your health care provider.  °SEEK MEDICAL CARE IF:  °· You think you are having a reaction to medicines taken. °· You have recurrent headaches or feel dizzy. °· You have swelling in your ankles. °· You have trouble with your vision. °SEEK IMMEDIATE MEDICAL CARE IF: °· You develop a severe headache or confusion. °·   You have unusual weakness, numbness, or feel faint. °· You have severe chest or abdominal pain. °· You vomit repeatedly. °· You have trouble breathing. °MAKE SURE YOU:  °· Understand these instructions. °· Will watch your condition. °· Will get help right away if you are not doing well or get  worse. °Document Released: 08/23/2005 Document Revised: 01/07/2014 Document Reviewed: 06/15/2013 °[employer]® Patient Information ©2015 [employer]. This information is not intended to replace advice given to you by your health care provider. Make sure you discuss any questions you have with your health care provider. ° °How to Take Your Blood Pressure °HOW DO I GET A BLOOD PRESSURE MACHINE? °· You can buy an electronic home blood pressure machine at your local pharmacy. Insurance will sometimes cover the cost if you have a prescription. °· Ask your doctor what type of machine is best for you. There are different machines for your arm and your wrist. °· If you decide to buy a machine to check your blood pressure on your arm, first check the size of your arm so you can buy the right size cuff. To check the size of your arm:   °¨ Use a measuring tape that shows both inches and centimeters.   °¨ Wrap the measuring tape around the upper-middle part of your arm. You may need someone to help you measure.   °¨ Write down your arm measurement in both inches and centimeters.   °· To measure your blood pressure correctly, it is important to have the right size cuff.   °¨ If your arm is up to 13 inches (up to 34 centimeters), get an adult cuff size. °¨ If your arm is 13 to 17 inches (35 to 44 centimeters), get a large adult cuff size.   °¨  If your arm is 17 to 20 inches (45 to 52 centimeters), get an adult thigh cuff.   °WHAT DO THE NUMBERS MEAN?  °· There are two numbers that make up your blood pressure. For example: 120/80. °¨ The first number (120 in our example) is called the "systolic pressure." It is a measure of the pressure in your blood vessels when your heart is pumping blood. °¨ The second number (80 in our example) is called the "diastolic pressure." It is a measure of the pressure in your blood vessels when your heart is resting between beats. °· Your doctor will tell you what your blood pressure should be. °WHAT  SHOULD I DO BEFORE I CHECK MY BLOOD PRESSURE?  °· Try to rest or relax for at least 30 minutes before you check your blood pressure. °· Do not smoke. °· Do not have any drinks with caffeine, such as: °¨ Soda. °¨ Coffee. °¨ Tea. °· Check your blood pressure in a quiet room. °· Sit down and stretch out your arm on a table. Keep your arm at about the level of your heart. Let your arm relax. °· Make sure that your legs are not crossed. °HOW DO I CHECK MY BLOOD PRESSURE? °· Follow the directions that came with your machine. °· Make sure you remove any tight-fighting clothing from your arm or wrist. Wrap the cuff around your upper arm or wrist. You should be able to fit a finger between the cuff and your arm. If you cannot fit a finger between the cuff and your arm, it is too tight and should be removed and rewrapped. °· Some units require you to manually pump up the arm cuff. °· Automatic units inflate the cuff when you press a   button. °· Cuff deflation is automatic in both models. °· After the cuff is inflated, the unit measures your blood pressure and pulse. The readings are shown on a monitor. Hold still and breathe normally while the cuff is inflated. °· Getting a reading takes less than a minute. °· Some models store readings in a memory. Some provide a printout of readings. If your machine does not store your readings, keep a written record. °· Take readings with you to your next visit with your doctor. °Document Released: 08/05/2008 Document Revised: 01/07/2014 Document Reviewed: 10/18/2013 °ExitCare® Patient Information ©2015 [employer]. This information is not intended to replace advice given to you by your health care provider. Make sure you discuss any questions you have with your health care provider. ° ° °

## 2015-02-20 NOTE — ED Provider Notes (Signed)
TIME SEEN: 2:55 AM  CHIEF COMPLAINT: Hypertension  HPI:  HPI Comments: Emily [name] is a 69 y.o. female with history of hyperlipidemia who presents to the Emergency Department complaining of hypertension that began tonight. Pt notes having a frontal headache around 9 PM tonight (6 hours ago). She mentions that approximately 2 hours after she became nauseous. Pt decided to take her blood pressure at that time and notes it being 169/89. Pt states that she does not take her blood pressure every day. She last took her blood pressure 3 weeks ago and reports it being 140/70. She does mention that for the past 2 days she has been having lower back pain after gardening and thinks this may have caused her blood pressure to go up. Denies any back pain currently. Denies any recent injury, trauma, or fall. Pt is currently asymptomatic. Denies current headache or nausea. Denies visual changes, chest pain, shortness of breath, numbness, weakness, paresthesia, vomiting, or any other associated symptoms.  Denies abdominal pain.  ROS: See HPI Constitutional: no fever  Eyes: no drainage  ENT: no runny nose   Cardiovascular:  no chest pain  Resp: no SOB  GI: nausea. no vomiting GU: no dysuria Integumentary: no rash  Allergy: no hives  Musculoskeletal: no leg swelling  Neurological: headache. no slurred speech ROS otherwise negative  PAST MEDICAL HISTORY/PAST SURGICAL HISTORY:  Past Medical History  Diagnosis Date  . Headache(784.0)   . Arthritis   . Kidney stone 11-14-2009   passed on own  . Right shoulder pain     at night  . GERD (gastroesophageal reflux disease)   . Hypercholesterolemia     MEDICATIONS:  Prior to Admission medications   Medication Sig Start Date End Date Taking? Authorizing Provider  pantoprazole (PROTONIX) 40 MG tablet Take 40 mg by mouth daily.    Historical Provider, MD  rivaroxaban (XARELTO) 10 MG TABS tablet Take 1 tablet (10 mg total) by mouth daily with breakfast.  11/18/11   Avel Peace, PA-C  rosuvastatin (CRESTOR) 10 MG tablet Take 10 mg by mouth daily at 12 noon.    Historical Provider, MD    ALLERGIES:  Allergies  Allergen Reactions  . Sulfa Antibiotics Other (See Comments)    Eye turned yellow    SOCIAL HISTORY:  History  Substance Use Topics  . Smoking status: Never Smoker   . Smokeless tobacco: Never Used  . Alcohol Use: No    FAMILY HISTORY: No family history on file.  EXAM: Triage Vitals: BP 180/89 mmHg  Pulse 77  Temp(Src) 98.1 F (36.7 C)  Resp 16  SpO2 96%   CONSTITUTIONAL: Alert and oriented and responds appropriately to questions. Well-appearing; well-nourished, smiling, pleasant, no distress HEAD: Normocephalic EYES: Conjunctivae clear, PERRL ENT: normal nose; no rhinorrhea; moist mucous membranes; pharynx without lesions noted NECK: Supple, no meningismus, no LAD  CARD: RRR; S1 and S2 appreciated; no murmurs, no clicks, no rubs, no gallops RESP: Normal chest excursion without splinting or tachypnea; breath sounds clear and equal bilaterally; no wheezes, no rhonchi, no rales, no hypoxia or respiratory distress, speaking full sentences ABD/GI: Normal bowel sounds; non-distended; soft, non-tender, no rebound, no guarding, no peritoneal signs BACK:  The back appears normal and is non-tender to palpation, there is no CVA tenderness EXT: Normal ROM in all joints; non-tender to palpation; no edema; normal capillary refill; no cyanosis, no calf tenderness or swelling    SKIN: Normal color for age and race; warm NEURO: Moves all  extremities equally, sensation to light touch intact diffusely, cranial nerves II through XII intact, normal gait PSYCH: The patient's mood and manner are appropriate. Grooming and personal hygiene are appropriate.  MEDICAL DECISION MAKING: Patient here with headache, nausea earlier today to have now resolved. States she had elevated blood pressure at home. Blood pressure here is 160/80s. She has no  current complaints. EKG shows no ischemic changes. Labs, urine are unremarkable. I feel she is safe to be discharged home. I do not feel she needs to be started on antihypertensives agents at this time. Have advised her to keep a log of her blood pressure and follow-up with her primary care physician. Discussed return precautions. She verbalized understanding and is comfortable with plan.      EKG Interpretation  Date/Time:  Thursday February 20 2015 02:53:46 EDT Ventricular Rate:  59 PR Interval:  162 QRS Duration: 85 QT Interval:  437 QTC Calculation: 433 R Axis:   62 Text Interpretation:  Sinus rhythm Confirmed by Kameron Blethen,  DO, Decoda Van ([id number]) on 02/20/2015 2:59:17 AM        I personally performed the services described in this documentation, which was scribed in my presence. The recorded information has been reviewed and is accurate.    Layla Maw Marysa Wessner, DO 02/20/15 [phone]

## 2015-10-23 ENCOUNTER — Emergency Department (HOSPITAL_COMMUNITY)
Admission: EM | Admit: 2015-10-23 | Discharge: 2015-10-23 | Disposition: A | Discharge status: Home / Self Care | Payer: Medicare Other | Attending: Emergency Medicine | Admitting: Emergency Medicine

## 2015-10-23 ENCOUNTER — Encounter (HOSPITAL_COMMUNITY): Payer: Self-pay | Admitting: Emergency Medicine

## 2015-10-23 DIAGNOSIS — Z79899 Other long term (current) drug therapy: Secondary | ICD-10-CM | POA: Diagnosis not present

## 2015-10-23 DIAGNOSIS — M545 Low back pain, unspecified: Secondary | ICD-10-CM

## 2015-10-23 DIAGNOSIS — K219 Gastro-esophageal reflux disease without esophagitis: Secondary | ICD-10-CM | POA: Diagnosis not present

## 2015-10-23 DIAGNOSIS — E7800 Pure hypercholesterolemia, unspecified: Secondary | ICD-10-CM | POA: Diagnosis not present

## 2015-10-23 DIAGNOSIS — G8929 Other chronic pain: Secondary | ICD-10-CM | POA: Insufficient documentation

## 2015-10-23 DIAGNOSIS — Z7901 Long term (current) use of anticoagulants: Secondary | ICD-10-CM | POA: Diagnosis not present

## 2015-10-23 DIAGNOSIS — Z87442 Personal history of urinary calculi: Secondary | ICD-10-CM | POA: Diagnosis not present

## 2015-10-23 DIAGNOSIS — M1990 Unspecified osteoarthritis, unspecified site: Secondary | ICD-10-CM | POA: Insufficient documentation

## 2015-10-23 LAB — URINALYSIS, ROUTINE W REFLEX MICROSCOPIC
BILIRUBIN URINE: NEGATIVE
GLUCOSE, UA: NEGATIVE mg/dL
HGB URINE DIPSTICK: NEGATIVE
KETONES UR: NEGATIVE mg/dL
Leukocytes, UA: NEGATIVE
NITRITE: NEGATIVE
PH: 7.5 (ref 5.0–8.0)
Protein, ur: NEGATIVE mg/dL
Specific Gravity, Urine: 1.016 (ref 1.005–1.030)

## 2015-10-23 MED ORDER — IBUPROFEN 400 MG PO TABS: 400.0000 mg | ORAL_TABLET | Freq: Four times a day (QID) | ORAL | Status: DC | PRN

## 2015-10-23 MED ORDER — OXYCODONE-ACETAMINOPHEN 5-325 MG PO TABS: 1.0000 | ORAL_TABLET | ORAL | Status: DC | PRN

## 2015-10-23 MED ORDER — METHOCARBAMOL 500 MG PO TABS: 500.0000 mg | ORAL_TABLET | Freq: Two times a day (BID) | ORAL | Status: DC

## 2015-10-23 MED ORDER — METHOCARBAMOL 500 MG PO TABS
500.0000 mg | ORAL_TABLET | Freq: Once | ORAL | Status: AC
  Administered 2015-10-23: 500 mg via ORAL
  Filled 2015-10-23: qty 1

## 2015-10-23 MED ORDER — LIDOCAINE 5 % EX PTCH: 1.0000 | MEDICATED_PATCH | CUTANEOUS | Status: DC

## 2015-10-23 MED ORDER — OXYCODONE-ACETAMINOPHEN 5-325 MG PO TABS
1.0000 | ORAL_TABLET | Freq: Once | ORAL | Status: AC
  Administered 2015-10-23: 1 via ORAL
  Filled 2015-10-23: qty 1

## 2015-10-23 MED ORDER — KETOROLAC TROMETHAMINE 60 MG/2ML IM SOLN
30.0000 mg | Freq: Once | INTRAMUSCULAR | Status: AC
  Administered 2015-10-23: 30 mg via INTRAMUSCULAR
  Filled 2015-10-23: qty 2

## 2015-10-23 NOTE — Discharge Instructions (Signed)
You have been seen today for back pain. Keep your appointment with Dr. Lequita Halt for March 1. Follow up with PCP as needed. Return to ED should symptoms worsen.

## 2015-10-23 NOTE — ED Notes (Signed)
MD at bedside. 

## 2015-10-23 NOTE — ED Notes (Signed)
Patient presents for left lower back pain since 1100 this morning (8/10 pain) radiating down to left hip (history of left hip replacement 2013), denies injury to same, denies numbness or tingling, reports urinary frequency since.

## 2015-10-23 NOTE — ED Provider Notes (Signed)
CSN: [id number]     Arrival date & time 10/23/15  1557 History  By signing my name below, I, Emily [name], attest that this documentation has been prepared under the direction and in the presence of Emily Toft PA-C. Electronically Signed: Bethel [name], ED Scribe. 10/23/2015 5:00 PM   Chief Complaint  Patient presents with  . Back Pain    The history is provided by the patient. No language interpreter was used.   Emily [name] is a 70 y.o. female with history of arthritis and kidney stones who presents to the Emergency Department complaining of new, constant, sore, 8/10 in severity, left lower back pain with onset this morning with a sudden position change from sitting to standing. The pain is worse with sitting. She had her left hip replaced in 2013 but this pain is different. This pain also differs from her kidney stones she's had. She has been ambulatory.  Pt denies difficulty urinating or urinary incontinence. Patient denies fever/chills, nausea/vomiting, neuro deficits, falls or trauma, or any other complaints. She has an appointment with her orthopedic surgeon, Dr. Lequita Halt, on 2015/11/11.    Past Medical History  Diagnosis Date  . Headache(784.0)   . Arthritis   . Kidney stone Nov 10, 2009   passed on own  . Right shoulder pain     at night  . GERD (gastroesophageal reflux disease)   . Hypercholesterolemia    Past Surgical History  Procedure Laterality Date  . Colonscopy  2002  . Total hip arthroplasty  11/15/2011    Procedure: TOTAL HIP ARTHROPLASTY;  Surgeon: Loanne Drilling, MD;  Location: [facility];  Service: Orthopedics;  Laterality: Left;   No family history on file. Social History  Substance Use Topics  . Smoking status: Never Smoker   . Smokeless tobacco: Never Used  . Alcohol Use: No   OB History    No data available     Review of Systems  Genitourinary: Negative for flank pain and difficulty urinating (or incontinence of urine).  Musculoskeletal: Positive for  back pain. Negative for neck pain.  Skin: Negative for color change and pallor.  Neurological: Negative for weakness and numbness.    Allergies  Sulfa antibiotics  Home Medications   Prior to Admission medications   Medication Sig Start Date End Date Taking? Authorizing Provider  ibuprofen (ADVIL,MOTRIN) 400 MG tablet Take 1 tablet (400 mg total) by mouth every 6 (six) hours as needed. 10/23/15   Calli Bashor C Aranda Bihm, PA-C  lidocaine (LIDODERM) 5 % Place 1 patch onto the skin daily. Remove & Discard patch within 12 hours or as directed by MD 10/23/15   Anselm Pancoast, PA-C  methocarbamol (ROBAXIN) 500 MG tablet Take 1 tablet (500 mg total) by mouth 2 (two) times daily. 10/23/15   Mattea Seger C Anslee Micheletti, PA-C  oxyCODONE-acetaminophen (PERCOCET/ROXICET) 5-325 MG tablet Take 1 tablet by mouth every 4 (four) hours as needed for severe pain. 10/23/15   Tiann Saha C Garcia Dalzell, PA-C  pantoprazole (PROTONIX) 40 MG tablet Take 40 mg by mouth daily.    Historical Provider, MD  rivaroxaban (XARELTO) 10 MG TABS tablet Take 1 tablet (10 mg total) by mouth daily with breakfast. 11/18/11   Avel Peace, PA-C  rosuvastatin (CRESTOR) 10 MG tablet Take 10 mg by mouth daily at 12 noon.    Historical Provider, MD   BP 185/105 mmHg  Pulse 95  Temp(Src) 98 F (36.7 C) (Oral)  Resp 18  SpO2 98% Physical Exam  Constitutional: She is  oriented to person, place, and time. She appears well-developed and well-nourished. No distress.  HENT:  Head: Normocephalic and atraumatic.  Eyes: Conjunctivae are normal.  Neck: Neck supple.  Cardiovascular: Normal rate and intact distal pulses.   Pulmonary/Chest: Effort normal. No respiratory distress.  Musculoskeletal: Normal range of motion.  Tenderness to left lumbar and sacral musculature. Full ROM in all extremities and spine. No paraspinal tenderness.   Neurological: She is alert and oriented to person, place, and time. She has normal reflexes.  No sensory deficits. Strength 5/5 in all extremities. No  gait disturbance. Coordination intact.   Skin: Skin is warm and dry.  Psychiatric: She has a normal mood and affect. Her behavior is normal.  Nursing note and vitals reviewed.   ED Course  Procedures (including critical care time) DIAGNOSTIC STUDIES: Oxygen Saturation is 98% on RA,  normal by my interpretation.    COORDINATION OF CARE: 4:55 PM Discussed treatment plan with pt at bedside and pt agreed to plan.  Labs Review Labs Reviewed  URINALYSIS, ROUTINE W REFLEX MICROSCOPIC (NOT AT [facility])    Imaging Review No results found. I have personally reviewed and evaluated these lab results as part of my medical decision-making.   EKG Interpretation None      MDM   Final diagnoses:  Left-sided low back pain without sciatica    [name] presents with acute on chronic back pain that recurred at 11 AM this morning.   Findings and plan of care discussed with Derwood Kaplan, MD.  Patient's presentation is consistent with a muscle strain. Patient has a normal neuro exam. Patient has no red flag symptoms. Patient improved with conservative management here in the ED. Patient was encouraged to keep her appointment with Dr. Lequita Halt on March 1. The patient was given instructions for home care as well as return precautions. Patient voices understanding of these instructions, accepts the plan, and is comfortable with discharge.  I personally performed the services described in this documentation, which was scribed in my presence. The recorded information has been reviewed and is accurate.    Anselm Pancoast, PA-C 10/23/15 1743  Derwood Kaplan, MD 10/24/15 1610

## 2016-03-27 ENCOUNTER — Encounter (HOSPITAL_COMMUNITY): Payer: Self-pay | Admitting: Emergency Medicine

## 2016-03-27 ENCOUNTER — Emergency Department (HOSPITAL_COMMUNITY)
Admission: EM | Admit: 2016-03-27 | Discharge: 2016-03-27 | Disposition: A | Discharge status: Home / Self Care | Payer: Medicare Other | Attending: Emergency Medicine | Admitting: Emergency Medicine

## 2016-03-27 DIAGNOSIS — Y929 Unspecified place or not applicable: Secondary | ICD-10-CM | POA: Diagnosis not present

## 2016-03-27 DIAGNOSIS — Y939 Activity, unspecified: Secondary | ICD-10-CM | POA: Insufficient documentation

## 2016-03-27 DIAGNOSIS — M1990 Unspecified osteoarthritis, unspecified site: Secondary | ICD-10-CM | POA: Diagnosis not present

## 2016-03-27 DIAGNOSIS — S70362A Insect bite (nonvenomous), left thigh, initial encounter: Secondary | ICD-10-CM | POA: Insufficient documentation

## 2016-03-27 DIAGNOSIS — T63484A Toxic effect of venom of other arthropod, undetermined, initial encounter: Secondary | ICD-10-CM

## 2016-03-27 DIAGNOSIS — W57XXXA Bitten or stung by nonvenomous insect and other nonvenomous arthropods, initial encounter: Secondary | ICD-10-CM | POA: Diagnosis not present

## 2016-03-27 DIAGNOSIS — Y999 Unspecified external cause status: Secondary | ICD-10-CM | POA: Diagnosis not present

## 2016-03-27 MED ORDER — IBUPROFEN 400 MG PO TABS: 400.0000 mg | ORAL_TABLET | Freq: Four times a day (QID) | ORAL | Status: DC | PRN

## 2016-03-27 NOTE — ED Notes (Addendum)
Per patient, she had a bee sting last night on left femur.  She took Benadryl afterwards.  She is now experiencing soreness/pain  in left leg when walking.    Patient denies any falls/trauma.  Denies any fever or chilis.

## 2016-03-27 NOTE — ED Notes (Signed)
Bed: [facility] Expected date:  Expected time:  Means of arrival:  Comments: 

## 2016-03-27 NOTE — ED Provider Notes (Signed)
CSN: [id number]     Arrival date & time 03/27/16  1436 History   First MD Initiated Contact with Patient 03/27/16 1819     Chief Complaint  Patient presents with  . Hip Pain  . Insect Bite   HPI Patient presents to the emergency room after being stung by a bee last night on her right thigh. She took some Benadryl afterwards. Morning she noticed soreness in her left thigh. She notices it primarily when she is walking. The area is not too tender to touch. He denies any trouble with fevers or chills. Patient was concerned because she had a hip replacement surgery on that left hip several years ago. She's never had this trouble before and just wanted to make sure there wasn't an issue with her hip surgery. Past Medical History  Diagnosis Date  . Headache(784.0)   . Arthritis   . Kidney stone December 04, 2009   passed on own  . Right shoulder pain     at night  . GERD (gastroesophageal reflux disease)   . Hypercholesterolemia    Past Surgical History  Procedure Laterality Date  . Colonscopy  2002  . Total hip arthroplasty  11/15/2011    Procedure: TOTAL HIP ARTHROPLASTY;  Surgeon: Loanne Drilling, MD;  Location: [facility];  Service: Orthopedics;  Laterality: Left;   No family history on file. Social History  Substance Use Topics  . Smoking status: Never Smoker   . Smokeless tobacco: Never Used  . Alcohol Use: No   OB History    No data available     Review of Systems  All other systems reviewed and are negative.     Allergies  Sulfa antibiotics  Home Medications   Prior to Admission medications   Medication Sig Start Date End Date Taking? Authorizing Provider  ibuprofen (ADVIL,MOTRIN) 400 MG tablet Take 1 tablet (400 mg total) by mouth every 6 (six) hours as needed. 03/27/16   Linwood Dibbles, MD  lidocaine (LIDODERM) 5 % Place 1 patch onto the skin daily. Remove & Discard patch within 12 hours or as directed by MD 10/23/15   Hillard Danker Joy, PA-C  pantoprazole (PROTONIX) 40 MG tablet Take 40  mg by mouth daily.    Historical Provider, MD  rosuvastatin (CRESTOR) 10 MG tablet Take 10 mg by mouth daily at 12 noon.    Historical Provider, MD   BP 167/99 mmHg  Pulse 90  Temp(Src) 98.7 F (37.1 C) (Oral)  Resp 16  SpO2 100% Physical Exam  Constitutional: She appears well-developed and well-nourished. No distress.  HENT:  Head: Normocephalic and atraumatic.  Right Ear: External ear normal.  Left Ear: External ear normal.  Eyes: Conjunctivae are normal. Right eye exhibits no discharge. Left eye exhibits no discharge. No scleral icterus.  Neck: Neck supple. No tracheal deviation present.  Cardiovascular: Normal rate.   Pulmonary/Chest: Effort normal. No stridor. No respiratory distress.  Musculoskeletal: She exhibits no edema.       Left hip: Normal. She exhibits normal range of motion, normal strength, no tenderness, no bony tenderness and no swelling.       Left knee: Normal.  Small 3 cm circular area of erythema left distal medial thigh, no fluctuance, no lymphangitic streaking  Neurological: She is alert. Cranial nerve deficit: no gross deficits.  Skin: Skin is warm and dry. No rash noted.  Psychiatric: She has a normal mood and affect.  Nursing note and vitals reviewed.   ED Course  Procedures (  including critical care time) Labs Review Labs Reviewed - No data to display  Imaging Review No results found. I have personally reviewed and evaluated these images and lab results as part of my medical decision-making.   EKG Interpretation None      MDM   Final diagnoses:  Insect sting, undetermined intent, initial encounter    I suspect her soreness is related to the insect sting. There does not appear to be any evidence of infection. Patient has no pain with movement of her left hip. I do not think she is having any complications associated with her previous hip surgery. Patient has not taken anything for pain or discomfort today. I recommended ibuprofen. She  understands to monitor for fevers chills worsening symptoms.    Linwood Dibbles, MD 03/27/16 [phone]

## 2016-03-27 NOTE — Discharge Instructions (Signed)
Bee, Wasp, or [employer], wasps, and hornets are part of a family of insects that can sting people. These stings can cause pain and inflammation, but they are usually not serious. However, some people may have an allergic reaction to a sting. This can cause the symptoms to be more severe.  SYMPTOMS  Common symptoms of this condition include:   A red lump in the skin that sometimes has a tiny hole in the center. In some cases, a stinger may be in the center of the wound.  Pain and itching at the sting site.  Redness and swelling around the sting site. If you have an allergic reaction (localized allergic reaction), the swelling and redness may spread out from the sting site. In some cases, this reaction can continue to develop over the next 12-36 hours. In rare cases, a person may have a severe allergic reaction (anaphylactic reaction) to a sting. Symptoms of an anaphylactic reaction may include:   Wheezing or difficulty breathing.  Raised, itchy, red patches on the skin.  Nausea or vomiting.  Abdominal cramping.  Diarrhea.  Chest pain.  Fainting.  Redness of the face (flushing). DIAGNOSIS  This condition is usually diagnosed based on symptoms, medical history, and a physical exam. TREATMENT  Most stings can be treated with:   Icing to reduce swelling.  Medicines (antihistamines) to treat itching or an allergic reaction.  Medicines to help reduce pain. These may be medicines that you take by mouth, or medicated creams or lotions that you apply to your skin. If you were stung by a bee, the stinger and a small sac of poison may be in the wound. This may be removed by brushing across it with a flat card, such as a credit card. Another method is to pinch the area and pull it out. These methods can help reduce the severity of the body's reaction to the sting.  HOME CARE INSTRUCTIONS   Wash the sting site daily with soap and water as told by your health care provider.  Apply  or take over-the-counter and prescription medicines only as told by your health care provider.  If directed, apply ice to the sting area.  Put ice in a plastic bag.  Place a towel between your skin and the bag.  Leave the ice on for 20 minutes, 2-3 times per day.  Do not scratch the sting area.  To lessen pain, try using a paste that is made of water and baking soda. Rub the paste on the sting area and leave it on for 5 minutes.  If you had a severe allergic reaction to a sting, you may need:  To wear a medical bracelet or necklace that lists the allergy.  To learn when and how to use an anaphylaxis kit or epinephrine injection. Your family members may also need to learn this.  To carry an anaphylaxis kit with you at all times. SEEK MEDICAL CARE IF:   Your symptoms do not get better in 2-3 days.  You have redness, swelling, or pain that spreads beyond the area of the sting.  You have a fever. SEEK IMMEDIATE MEDICAL CARE IF:  You have symptoms of a severe allergic reaction. These include:   Wheezing or difficulty breathing.  Chest pain.  Light-headedness or fainting.  Itchy, raised, red patches on the skin.  Nausea or vomiting.  Abdominal cramping.  Diarrhea.   This information is not intended to replace advice given to you by your health care provider.  Make sure you discuss any questions you have with your health care provider. °  °Document Released: 08/23/2005 Document Revised: 05/14/2015 Document Reviewed: 01/08/2015 °Elsevier Interactive Patient Education ©2016 Elsevier Inc[employer] ° °

## 2017-04-13 ENCOUNTER — Ambulatory Visit (HOSPITAL_COMMUNITY)
Admission: EM | Admit: 2017-04-13 | Discharge: 2017-04-13 | Disposition: A | Discharge status: Home / Self Care | Payer: Medicare Other | Attending: Family Medicine | Admitting: Family Medicine

## 2017-04-13 ENCOUNTER — Encounter (HOSPITAL_COMMUNITY): Payer: Self-pay | Admitting: *Deleted

## 2017-04-13 DIAGNOSIS — T63441A Toxic effect of venom of bees, accidental (unintentional), initial encounter: Secondary | ICD-10-CM

## 2017-04-13 DIAGNOSIS — S00561A Insect bite (nonvenomous) of lip, initial encounter: Secondary | ICD-10-CM | POA: Diagnosis not present

## 2017-04-13 DIAGNOSIS — R220 Localized swelling, mass and lump, head: Secondary | ICD-10-CM | POA: Diagnosis not present

## 2017-04-13 MED ORDER — HYDROXYZINE HCL 25 MG PO TABS: 25.0000 mg | ORAL_TABLET | Freq: Four times a day (QID) | ORAL | 0 refills | Status: DC

## 2017-04-13 MED ORDER — PREDNISONE 20 MG PO TABS: ORAL_TABLET | ORAL | 0 refills | Status: DC

## 2017-04-13 NOTE — ED Triage Notes (Signed)
Pt   Stung  On lip today    She  Has    Swelling  Present         She  Is   Sitting  Upright  On  Exam table   Speaking  In  Complete    sentances

## 2017-04-13 NOTE — ED Provider Notes (Signed)
  [facility]   [id number] 04/13/17 Arrival Time: 1334  ASSESSMENT & PLAN:  1. Bee sting, accidental or unintentional, initial encounter     Meds ordered this encounter  Medications  . predniSONE (DELTASONE) 20 MG tablet    Sig: Take 3 po qd x 2d    Dispense:  6 tablet    Refill:  0    Order Specific Question:   Supervising Provider    AnswerMardella Layman:   HAGLER, BRIAN [[id number]]  . hydrOXYzine (ATARAX/VISTARIL) 25 MG tablet    Sig: Take 1 tablet (25 mg total) by mouth every 6 (six) hours.    Dispense:  12 tablet    Refill:  0    Order Specific Question:   Supervising Provider    Answer:   Mardella LaymanHAGLER, BRIAN [[id number]]    Reviewed expectations re: course of current medical issues. Questions answered. Outlined signs and symptoms indicating need for more acute intervention. Patient verbalized understanding. After Visit Summary given.   SUBJECTIVE:  Emily [name] is a 71 y.o. female who presents with complaint of bee sting on upper lip today.  She is c/o upper lip swelling and pain but denies SOB, stridor, or throat swelling.  ROS: As per HPI.   OBJECTIVE:  Vitals:   04/13/17 1353  BP: (!) 150/100  Pulse: 78  Resp: 18  Temp: 98.6 F (37 C)  TempSrc: Oral  SpO2: 100%     General appearance: alert; no distress HEENT: normocephalic; atraumatic; conjunctivae normal; TMs normal; nasal mucosa normal; oral mucosa normal.  Swelling upper lip. Neck: supple Lungs: clear to auscultation bilaterally Heart: regular rate and rhythm Neurologic: normal symmetric reflexes; normal gait Psychological:  alert and cooperative; normal mood and affect    Labs Reviewed - No data to display  No results found.  Allergies  Allergen Reactions  . Sulfa Antibiotics Other (See Comments)    Eye turned yellow    PMHx, SurgHx, SocialHx, Medications, and Allergies were reviewed in the Visit Navigator and updated as appropriate.      Deatra CanterOxford, Oveta Idris J, [place]FNP 04/13/17 1358

## 2017-04-13 NOTE — ED Notes (Signed)
Discharged  By  Rowe Clacktraci  Best

## 2018-09-26 ENCOUNTER — Other Ambulatory Visit: Payer: Self-pay | Admitting: Internal Medicine

## 2018-09-26 DIAGNOSIS — R0789 Other chest pain: Secondary | ICD-10-CM

## 2018-10-23 ENCOUNTER — Ambulatory Visit: Payer: Medicare Other | Admitting: Cardiology

## 2018-10-23 DIAGNOSIS — R0789 Other chest pain: Secondary | ICD-10-CM | POA: Diagnosis not present

## 2018-10-23 NOTE — Progress Notes (Signed)
Treadmill exercise stress test 10/23/2018: Indication: chest tightness The patient exercised on Bruce protocol for 03:19 min. Patient achieved  5.01 METS and reached HR  126 bpm, which is  85 % of maximum age-predicted HR.  Stress test terminated due to hip pain.  Target HR is barely reached and could not be sustained for >1 minute.  Resting EKG demonstrates Normal sinus rhythm. ST Changes: With peak exercise there was no ST-T changes of ischemia. Arrhythmias: none. BP Response to Exercise: Resting hypertension- appropriate response. Exercise capacity was below average for age. Recommendations:  Consider  pharmacologic stress if clinically indicated.  Stress test is equivocal for ischemia due to inability to achieve THR (unable to sustain HR). Clinical correlation recommended.

## 2018-11-15 NOTE — Progress Notes (Deleted)
Patient referred by Georgianne Fick, MD for ***  Subjective:   Emily [name], female    DOB: 06-07-1946, 73 y.o.   MRN: [id number]   No chief complaint on file.   *** HPI  73 y.o. *** female with ***  *** Past Medical History:  Diagnosis Date  . Arthritis   . GERD (gastroesophageal reflux disease)   . Headache(784.0)   . Hypercholesterolemia   . Kidney stone 2009-12-08  passed on own  . Right shoulder pain    at night    *** Past Surgical History:  Procedure Laterality Date  . colonscopy  2002  . TOTAL HIP ARTHROPLASTY  11/15/2011   Procedure: TOTAL HIP ARTHROPLASTY;  Surgeon: Loanne Drilling, MD;  Location: [facility];  Service: Orthopedics;  Laterality: Left;    *** Social History   Socioeconomic History  . Marital status: Married    Spouse name: Not on file  . Number of children: Not on file  . Years of education: Not on file  . Highest education level: Not on file  Occupational History  . Not on file  Social Needs  . Financial resource strain: Not on file  . Food insecurity:    Worry: Not on file    Inability: Not on file  . Transportation needs:    Medical: Not on file    Non-medical: Not on file  Tobacco Use  . Smoking status: Never Smoker  . Smokeless tobacco: Never Used  Substance and Sexual Activity  . Alcohol use: No  . Drug use: No  . Sexual activity: Not on file  Lifestyle  . Physical activity:    Days per week: Not on file    Minutes per session: Not on file  . Stress: Not on file  Relationships  . Social connections:    Talks on phone: Not on file    Gets together: Not on file    Attends religious service: Not on file    Active member of club or organization: Not on file    Attends meetings of clubs or organizations: Not on file    Relationship status: Not on file  . Intimate partner violence:    Fear of current or ex partner: Not on file    Emotionally abused: Not on file    Physically abused: Not on file    Forced  sexual activity: Not on file  Other Topics Concern  . Not on file  Social History Narrative  . Not on file    *** Current Outpatient Medications on File Prior to Visit  Medication Sig Dispense Refill  . hydrOXYzine (ATARAX/VISTARIL) 25 MG tablet Take 1 tablet (25 mg total) by mouth every 6 (six) hours. 12 tablet 0  . ibuprofen (ADVIL,MOTRIN) 400 MG tablet Take 1 tablet (400 mg total) by mouth every 6 (six) hours as needed. 20 tablet 0  . lidocaine (LIDODERM) 5 % Place 1 patch onto the skin daily. Remove & Discard patch within 12 hours or as directed by MD 30 patch 0  . pantoprazole (PROTONIX) 40 MG tablet Take 40 mg by mouth daily.    . predniSONE (DELTASONE) 20 MG tablet Take 3 po qd x 2d 6 tablet 0  . rosuvastatin (CRESTOR) 10 MG tablet Take 10 mg by mouth daily at 12 noon.     No current facility-administered medications on file prior to visit.     Cardiovascular studies:  ***  Treadmill exercise stress test 10/23/2018: Indication:  chest tightness The patient exercised on Bruce protocol for 03:19 min. Patient achieved  5.01 METS and reached HR  126 bpm, which is  85 % of maximum age-predicted HR.  Stress test terminated due to hip pain.  Target HR is barely reached and could not be sustained for >1 minute.  Resting EKG demonstrates Normal sinus rhythm. ST Changes: With peak exercise there was no ST-T changes of ischemia. Arrhythmias: none. BP Response to Exercise: Resting hypertension- appropriate response. Exercise capacity was below average for age. Recommendations:  Consider  pharmacologic stress if clinically indicated.  Stress test is equivocal for ischemia due to inability to achieve THR (unable to sustain HR). Clinical correlation recommended.   Recent labs:  ***  CBC Latest Ref Rng & Units 02/19/2015  WBC 4.0 - 10.5 K/uL 7.3  Hemoglobin 12.0 - 15.0 g/dL 82.9  Hematocrit 56.2 - 46.0 % 41.6  Platelets 150 - 400 K/uL 262   CMP Latest Ref Rng & Units 02/19/2015   Glucose 65 - 99 mg/dL 130(Q)  BUN 6 - 20 mg/dL 9  Creatinine 6.57 - 8.46 mg/dL 9.62  Sodium 952 - 841 mmol/L 139  Potassium 3.5 - 5.1 mmol/L 3.2(L)  Chloride 101 - 111 mmol/L 107  CO2 22 - 32 mmol/L 23  Calcium 8.9 - 10.3 mg/dL 9.7  Total Protein 6.5 - 8.1 g/dL 6.6  Total Bilirubin 0.3 - 1.2 mg/dL 0.5  Alkaline Phos 38 - 126 U/L 64  AST 15 - 41 U/L 23  ALT 14 - 54 U/L 18     Review of Systems  Constitution: Negative for decreased appetite, malaise/fatigue, weight gain and weight loss.  HENT: Negative for congestion.   Eyes: Negative for visual disturbance.  Cardiovascular: Positive for chest pain. Negative for dyspnea on exertion, leg swelling, palpitations and syncope.  Respiratory: Negative for shortness of breath.   Endocrine: Negative for cold intolerance.  Hematologic/Lymphatic: Does not bruise/bleed easily.  Skin: Negative for itching and rash.  Musculoskeletal: Negative for myalgias.  Gastrointestinal: Negative for abdominal pain, nausea and vomiting.  Genitourinary: Negative for dysuria.  Neurological: Negative for dizziness and weakness.  Psychiatric/Behavioral: The patient is not nervous/anxious.   All other systems reviewed and are negative.       *** There were no vitals filed for this visit.  Objective:   Physical Exam  Constitutional: She appears well-developed and well-nourished. No distress.  HENT:  Head: Atraumatic.  Eyes: Conjunctivae are normal.  Neck: Neck supple. No JVD present. No thyromegaly present.  Cardiovascular: Normal rate, regular rhythm, normal heart sounds and intact distal pulses. Exam reveals no gallop.  No murmur heard. Pulmonary/Chest: Effort normal and breath sounds normal.  Abdominal: Soft. Bowel sounds are normal.  Musculoskeletal: Normal range of motion.        General: No edema.  Neurological: She is alert.  Skin: Skin is warm and dry.  Psychiatric: She has a normal mood and affect.          Assessment &  Recommendations:   ***  There are no diagnoses linked to this encounter.   Thank you for referring the patient to [place]. Please feel free to contact with any questions.  Elder Negus, MD [facility] [place] Pager: [phone] Office: [phone] If no answer Cell [phone]

## 2018-11-16 ENCOUNTER — Ambulatory Visit: Payer: Medicare Other | Admitting: Cardiology

## 2018-11-29 ENCOUNTER — Ambulatory Visit: Payer: Medicare Other | Admitting: Cardiology

## 2019-01-12 ENCOUNTER — Telehealth: Payer: Self-pay

## 2019-02-26 ENCOUNTER — Other Ambulatory Visit: Payer: Self-pay | Admitting: Internal Medicine

## 2019-02-26 DIAGNOSIS — R1319 Other dysphagia: Secondary | ICD-10-CM

## 2019-02-26 DIAGNOSIS — R1310 Dysphagia, unspecified: Secondary | ICD-10-CM

## 2019-03-01 ENCOUNTER — Ambulatory Visit
Admission: RE | Admit: 2019-03-01 | Discharge: 2019-03-01 | Disposition: A | Discharge status: Home / Self Care | Payer: Medicare Other | Source: Ambulatory Visit | Attending: Internal Medicine | Admitting: Internal Medicine

## 2019-03-01 DIAGNOSIS — R1319 Other dysphagia: Secondary | ICD-10-CM

## 2019-03-01 DIAGNOSIS — R1310 Dysphagia, unspecified: Secondary | ICD-10-CM

## 2019-03-15 ENCOUNTER — Encounter: Payer: Self-pay | Admitting: Cardiology

## 2019-03-15 ENCOUNTER — Other Ambulatory Visit: Payer: Self-pay

## 2019-03-15 ENCOUNTER — Ambulatory Visit: Payer: Medicare Other | Admitting: Cardiology

## 2019-03-15 VITALS — BP 172/99 | HR 83 | Ht 63.0 in | Wt 146.3 lb

## 2019-03-15 DIAGNOSIS — K219 Gastro-esophageal reflux disease without esophagitis: Secondary | ICD-10-CM

## 2019-03-15 DIAGNOSIS — I358 Other nonrheumatic aortic valve disorders: Secondary | ICD-10-CM

## 2019-03-15 DIAGNOSIS — R9439 Abnormal result of other cardiovascular function study: Secondary | ICD-10-CM | POA: Diagnosis not present

## 2019-03-15 DIAGNOSIS — I209 Angina pectoris, unspecified: Secondary | ICD-10-CM | POA: Diagnosis not present

## 2019-03-15 DIAGNOSIS — E7800 Pure hypercholesterolemia, unspecified: Secondary | ICD-10-CM

## 2019-03-15 MED ORDER — METOPROLOL SUCCINATE ER 25 MG PO TB24: 25.0000 mg | ORAL_TABLET | Freq: Every day | ORAL | 1 refills | Status: DC

## 2019-03-15 MED ORDER — NITROGLYCERIN 0.4 MG SL SUBL: 0.4000 mg | SUBLINGUAL_TABLET | SUBLINGUAL | 1 refills | Status: DC | PRN

## 2019-03-15 NOTE — Progress Notes (Signed)
Primary Physician:  Merrilee Seashore, MD   Patient ID: Emily [name], female    DOB: 1946-08-30, 73 y.o.   MRN: [id number]  Subjective:    Chief Complaint  Patient presents with  . Chest Pain  . New Patient (Initial Visit)    HPI: Emily [name]  is a 73 y.o. female  with hyperlipidemia, family history of CAD underwent treadmill stress test in our office in Feb 2020 that was abnormal due to inability to reach target heart rate, now referred by Dr. Ashby Dawes.   Patient began having chest pain that did not improve with Nexium, hence Dr. Ashby Dawes had ordered stress test.  She continues to have exertional chest discomfort that improves with resting. Associated shortness of breath. Does not occur daily, but only with over exertion. She also occasionally has it with hot flashes. States that she has continued to have hot flashes since menopause, states that they never went away.  States that her blood pressure elevated at physician offices, but is normal at home. She forgot to take amlodipine this morning. She is scheduled to see GI next week for evaluation as she is also having difficulty with swallowing.  No former tobacco use. She does have family history of CAD in her mother, and 2 older siblings. None at earlier age. She does not exercise, but is fairly active.   Past Medical History:  Diagnosis Date  . Arthritis   . GERD (gastroesophageal reflux disease)   . Headache(784.0)   . Hypercholesterolemia   . Kidney stone December 09, 2009  passed on own  . Right shoulder pain    at night    Past Surgical History:  Procedure Laterality Date  . colonscopy  09-Dec-2000  . TOTAL HIP ARTHROPLASTY  11/15/2011   Procedure: TOTAL HIP ARTHROPLASTY;  Surgeon: Gearlean Alf, MD;  Location: [facility];  Service: Orthopedics;  Laterality: Left;    Social History   Socioeconomic History  . Marital status: Married    Spouse name: Not on file  . Number of children: 1  . Years of education: Not  on file  . Highest education level: Not on file  Occupational History  . Not on file  Social Needs  . Financial resource strain: Not on file  . Food insecurity    Worry: Not on file    Inability: Not on file  . Transportation needs    Medical: Not on file    Non-medical: Not on file  Tobacco Use  . Smoking status: Never Smoker  . Smokeless tobacco: Never Used  Substance and Sexual Activity  . Alcohol use: No  . Drug use: No  . Sexual activity: Not on file  Lifestyle  . Physical activity    Days per week: Not on file    Minutes per session: Not on file  . Stress: Not on file  Relationships  . Social Herbalist on phone: Not on file    Gets together: Not on file    Attends religious service: Not on file    Active member of club or organization: Not on file    Attends meetings of clubs or organizations: Not on file    Relationship status: Not on file  . Intimate partner violence    Fear of current or ex partner: Not on file    Emotionally abused: Not on file    Physically abused: Not on file    Forced sexual activity: Not on file  Other Topics Concern  . Not on file  Social History Narrative  . Not on file    Review of Systems  Constitution: Negative for decreased appetite, malaise/fatigue, weight gain and weight loss.  Eyes: Negative for visual disturbance.  Cardiovascular: Positive for chest pain and dyspnea on exertion. Negative for claudication, leg swelling, orthopnea, palpitations and syncope.  Respiratory: Negative for hemoptysis and wheezing.   Endocrine: Negative for cold intolerance and heat intolerance.  Hematologic/Lymphatic: Does not bruise/bleed easily.  Skin: Negative for nail changes.  Musculoskeletal: Negative for muscle weakness and myalgias.  Gastrointestinal: Positive for dysphagia. Negative for abdominal pain, change in bowel habit, nausea and vomiting.  Neurological: Negative for difficulty with concentration, dizziness, focal weakness  and headaches.  Psychiatric/Behavioral: Negative for altered mental status and suicidal ideas.  All other systems reviewed and are negative.     Objective:  Blood pressure (!) 172/99, pulse 83, height '5\' 3"'$  (1.6 m), weight 146 lb 4.8 oz (66.4 kg), SpO2 98 %. Body mass index is 25.92 kg/m.    Physical Exam  Constitutional: She is oriented to person, place, and time. Vital signs are normal. She appears well-developed and well-nourished.  HENT:  Head: Normocephalic and atraumatic.  Neck: Normal range of motion.  Cardiovascular: Normal rate, regular rhythm and intact distal pulses.  Murmur heard.  Early systolic murmur is present with a grade of 1/6 at the upper right sternal border. Pulmonary/Chest: Effort normal and breath sounds normal. No accessory muscle usage. No respiratory distress.  Abdominal: Soft. Bowel sounds are normal.  Musculoskeletal: Normal range of motion.  Neurological: She is alert and oriented to person, place, and time.  Skin: Skin is warm and dry.  Vitals reviewed.  Radiology: No results found.  Laboratory examination:   09/14/2018: CBC normal. Potassium 4.3, Creatinine 0.83, eGFR 71/81, Sodium 147, CMP otherwise normal. Cholesterol 267, triglycerides 115, HDL 83, LDL 116.  CMP Latest Ref Rng & Units 02/19/2015 11/18/2011 11/17/2011  Glucose 65 - 99 mg/dL 136(H) 118(H) 118(H)  BUN 6 - 20 mg/dL '9 7 7  '$ Creatinine 0.44 - 1.00 mg/dL 0.87 0.75 0.69  Sodium 135 - 145 mmol/L 139 136 130(L)  Potassium 3.5 - 5.1 mmol/L 3.2(L) 4.2 3.1(L)  Chloride 101 - 111 mmol/L 107 102 97  CO2 22 - 32 mmol/L '23 28 27  '$ Calcium 8.9 - 10.3 mg/dL 9.7 9.3 9.1  Total Protein 6.5 - 8.1 g/dL 6.6 - -  Total Bilirubin 0.3 - 1.2 mg/dL 0.5 - -  Alkaline Phos 38 - 126 U/L 64 - -  AST 15 - 41 U/L 23 - -  ALT 14 - 54 U/L 18 - -   CBC Latest Ref Rng & Units 02/19/2015 11/18/2011 11/17/2011  WBC 4.0 - 10.5 K/uL 7.3 11.2(H) 12.4(H)  Hemoglobin 12.0 - 15.0 g/dL 14.2 10.5(L) 11.0(L)  Hematocrit 36.0  - 46.0 % 41.6 30.2(L) 31.4(L)  Platelets 150 - 400 K/uL 262 201 196   Lipid Panel  No results found for: CHOL, TRIG, HDL, CHOLHDL, VLDL, LDLCALC, LDLDIRECT HEMOGLOBIN A1C No results found for: HGBA1C, MPG TSH No results for input(s): TSH in the last 8760 hours.  PRN Meds:. Medications Discontinued During This Encounter  Medication Reason  . pantoprazole (PROTONIX) 40 MG tablet Error  . hydrOXYzine (ATARAX/VISTARIL) 25 MG tablet Error  . ibuprofen (ADVIL,MOTRIN) 400 MG tablet Error  . lidocaine (LIDODERM) 5 % Error  . predniSONE (DELTASONE) 20 MG tablet Error   Current Meds  Medication Sig  . amLODipine (NORVASC) 2.5  MG tablet Take 2.5 mg by mouth daily.  . Cholecalciferol (VITAMIN D3) 1.25 MG (50000 UT) TABS Take by mouth daily.  Marland Kitchen esomeprazole (NEXIUM) 40 MG capsule Take 40 mg by mouth daily at 12 noon.  . Rosuvastatin Calcium 20 MG CPSP Take by mouth daily at 12 noon.     Cardiac Studies:   Treadmill exercise stress test 10/23/2018: Indication: chest tightness The patient exercised on Bruce protocol for 03:19 min. Patient achieved  5.01 METS and reached HR  126 bpm, which is  85 % of maximum age-predicted HR.  Stress test terminated due to hip pain.  Target HR is barely reached and could not be sustained for >1 minute.  Resting EKG demonstrates Normal sinus rhythm. ST Changes: With peak exercise there was no ST-T changes of ischemia. Arrhythmias: none. BP Response to Exercise: Resting hypertension- appropriate response. Exercise capacity was below average for age. Recommendations:  Consider  pharmacologic stress if clinically indicated.  Stress test is equivocal for ischemia due to inability to achieve THR (unable to sustain HR). Clinical correlation recommended.   Assessment:     ICD-10-CM   1. Abnormal stress test  R94.39   2. Angina pectoris (HCC)  I20.9 EKG 12-Lead    PCV ECHOCARDIOGRAM COMPLETE  3. Pure hypercholesterolemia  E78.00   4. Aortic systolic murmur on  examination  I35.8 PCV ECHOCARDIOGRAM COMPLETE  5. Gastroesophageal reflux disease without esophagitis  K21.9     EKG 03/15/2019: Normal sinus rhythm at 79 bpm, normal axis, no evidence of ischemia. Normal EKG.   Recommendations:   Patient with hypertension, hyperlipidemia, family history of CAD, here today for evaluation of abnormal GXT and chest pain.  Patient continues to have exertional chest pain that is concerning for angina. She also has risk factors for CAD. She was unable to achieve target heart rate on treadmill stress test. I have recommended further evaluation with lexiscan nuclear stress testing. She also has aortic systolic murmur that is likely ejection murmur. Will obtain echocardiogram to exclude any structural abnormalities. I will give her nitroglycerin for as needed use.   Hypertension is generally stable at home, but markedly elevated here. I will start her on Metoprolol succinate 25 mg daily both for hypertension and angina. Advised her to continue to follow up with GI for evaluation as this may also be related to GERD. I have not started ASA at this time in view of her GI issues.  She has hyperlipidemia and is on Crestor 20 mg daily. LDL continues to be slightly elevated; however, by PCP note it appears that she has just recently resumed taking daily. Will continue the same. I will see her back after testing for follow up.   *I have discussed this case with Dr. Virgina Jock and he personally examined the patient and participated in formulating the plan.*    Miquel Dunn, MSN, APRN, FNP-C [facility] [place] Office: [phone] Fax: [phone]

## 2019-03-16 NOTE — Addendum Note (Signed)
Addended by: Gwinda Maine on: 03/16/2019 10:48 AM   Modules accepted: Orders

## 2019-03-27 ENCOUNTER — Encounter (HOSPITAL_COMMUNITY): Payer: Self-pay

## 2019-03-27 ENCOUNTER — Other Ambulatory Visit: Payer: Self-pay

## 2019-03-27 ENCOUNTER — Emergency Department (HOSPITAL_COMMUNITY)
Admission: EM | Admit: 2019-03-27 | Discharge: 2019-03-28 | Disposition: A | Discharge status: Home / Self Care | Payer: Medicare Other | Attending: Emergency Medicine | Admitting: Emergency Medicine

## 2019-03-27 DIAGNOSIS — R42 Dizziness and giddiness: Secondary | ICD-10-CM | POA: Diagnosis not present

## 2019-03-27 DIAGNOSIS — Z79899 Other long term (current) drug therapy: Secondary | ICD-10-CM | POA: Insufficient documentation

## 2019-03-27 DIAGNOSIS — F419 Anxiety disorder, unspecified: Secondary | ICD-10-CM | POA: Insufficient documentation

## 2019-03-27 DIAGNOSIS — I10 Essential (primary) hypertension: Secondary | ICD-10-CM | POA: Diagnosis not present

## 2019-03-27 LAB — URINALYSIS, ROUTINE W REFLEX MICROSCOPIC
Bacteria, UA: NONE SEEN
Bilirubin Urine: NEGATIVE
Glucose, UA: NEGATIVE mg/dL
Hgb urine dipstick: NEGATIVE
Ketones, ur: NEGATIVE mg/dL
Nitrite: NEGATIVE
Protein, ur: NEGATIVE mg/dL
Specific Gravity, Urine: 1.002 — ABNORMAL LOW (ref 1.005–1.030)
pH: 8 (ref 5.0–8.0)

## 2019-03-27 LAB — CBC
HCT: 44.6 % (ref 36.0–46.0)
Hemoglobin: 15.1 g/dL — ABNORMAL HIGH (ref 12.0–15.0)
MCH: 31.5 pg (ref 26.0–34.0)
MCHC: 33.9 g/dL (ref 30.0–36.0)
MCV: 93.1 fL (ref 80.0–100.0)
Platelets: 287 10*3/uL (ref 150–400)
RBC: 4.79 MIL/uL (ref 3.87–5.11)
RDW: 12.7 % (ref 11.5–15.5)
WBC: 8.5 10*3/uL (ref 4.0–10.5)
nRBC: 0 % (ref 0.0–0.2)

## 2019-03-27 LAB — BASIC METABOLIC PANEL
Anion gap: 9 (ref 5–15)
BUN: 8 mg/dL (ref 8–23)
CO2: 25 mmol/L (ref 22–32)
Calcium: 9.8 mg/dL (ref 8.9–10.3)
Chloride: 105 mmol/L (ref 98–111)
Creatinine, Ser: 0.88 mg/dL (ref 0.44–1.00)
GFR calc Af Amer: >60 mL/min (ref >60)
GFR calc non Af Amer: >60 mL/min (ref >60)
Glucose, Bld: 88 mg/dL (ref 70–99)
Potassium: 3.6 mmol/L (ref 3.5–5.1)
Sodium: 139 mmol/L (ref 135–145)

## 2019-03-27 MED ORDER — SODIUM CHLORIDE 0.9% FLUSH: 3.0000 mL | Freq: Once | INTRAVENOUS | Status: DC

## 2019-03-27 NOTE — ED Triage Notes (Signed)
Pt reports about an hour ago she started to feel hot,anxious and dizzy. Pt has a stress test scheduled next week and was started on metoprolol on 7/9. Pt denies any symptoms at this time.

## 2019-03-28 NOTE — ED Provider Notes (Signed)
[facility] Provider Note   CSN: [id number] Arrival date & time: 03/27/19  1946     History   Chief Complaint Chief Complaint  Patient presents with  . Dizziness  . Anxiety    HPI Emily [name] is a 73 y.o. female.     The history is provided by the patient.  Dizziness Quality:  Lightheadedness Severity:  Moderate Onset quality:  Gradual Duration:  1 hour Timing:  Intermittent Progression:  Resolved Chronicity:  New Relieved by:  None tried Worsened by:  Nothing Associated symptoms: no chest pain, no headaches, no palpitations, no shortness of breath, no syncope, no vomiting and no weakness   Anxiety Pertinent negatives include no chest pain, no headaches and no shortness of breath.  Patient reports prior to arrival she had an hour long episode of feeling hot, anxious dizziness and lightheadedness.  No syncope.  No chest pain or shortness of breath.  All of her symptoms have since resolved.  She feels at baseline. No fevers or vomiting.  No cough. She reports she started metoprolol earlier this month at the direction of her cardiologist.  She is supposed to have a stress test next week  Past Medical History:  Diagnosis Date  . Arthritis   . GERD (gastroesophageal reflux disease)   . Headache(784.0)   . Hypercholesterolemia   . Kidney stone mar 2011   passed on own  . Right shoulder pain    at night    Patient Active Problem List   Diagnosis Date Noted  . Postop Hyponatremia 11/18/2011  . Postop Hypokalemia 11/18/2011  . Postop Acute blood loss anemia 11/18/2011  . Osteoarthritis of hip 11/15/2011    Past Surgical History:  Procedure Laterality Date  . colonscopy  2002  . TOTAL HIP ARTHROPLASTY  11/15/2011   Procedure: TOTAL HIP ARTHROPLASTY;  Surgeon: Loanne DrillingFrank V Aluisio, MD;  Location: [facility];  Service: Orthopedics;  Laterality: Left;     OB History   No obstetric history on file.      Home Medications     Prior to Admission medications   Medication Sig Start Date End Date Taking? Authorizing Provider  amLODipine (NORVASC) 2.5 MG tablet Take 2.5 mg by mouth daily.    [provider]  Cholecalciferol (VITAMIN D3) 1.25 MG (50000 UT) TABS Take by mouth daily.    [provider]  esomeprazole (NEXIUM) 40 MG capsule Take 40 mg by mouth daily at 12 noon.    [provider]  metoprolol succinate (TOPROL XL) 25 MG 24 hr tablet Take 1 tablet (25 mg total) by mouth daily. 03/15/19   Toniann FailKelley, Ashton Haynes, NP  nitroGLYCERIN (NITROSTAT) 0.4 MG SL tablet Place 1 tablet (0.4 mg total) under the tongue every 5 (five) minutes as needed for chest pain. 03/15/19 06/13/19  Toniann FailKelley, Ashton Haynes, NP  Rosuvastatin Calcium 20 MG CPSP Take by mouth daily at 12 noon.     [provider]    Family History Family History  Problem Relation Age of Onset  . Heart attack Mother   . Heart attack Sister   . Heart attack Brother     Social History Social History   Tobacco Use  . Smoking status: Never Smoker  . Smokeless tobacco: Never Used  Substance Use Topics  . Alcohol use: No  . Drug use: No     Allergies   Sulfa antibiotics   Review of Systems Review of Systems  Constitutional: Negative for fever.  Respiratory: Negative for cough and shortness of breath.   Cardiovascular: Negative for chest pain, palpitations, leg swelling and syncope.  Gastrointestinal: Negative for vomiting.  Neurological: Positive for dizziness. Negative for syncope, weakness and headaches.  Psychiatric/Behavioral: The patient is nervous/anxious.   All other systems reviewed and are negative.    Physical Exam Updated Vital Signs BP (!) 182/96 (BP Location: Right Arm)   Pulse 71   Temp 98.5 F (36.9 C) (Oral)   Resp 16   SpO2 100%   Physical Exam CONSTITUTIONAL: Well developed/well nourished HEAD: Normocephalic/atraumatic EYES: EOMI/PERRL ENMT: Mucous membranes moist NECK: supple no  meningeal signs SPINE/BACK:entire spine nontender CV: S1/S2 noted, no murmurs/rubs/gallops noted, there are no loud/harsh heart murmurs LUNGS: Lungs are clear to auscultation bilaterally, no apparent distress ABDOMEN: soft, nontender, no rebound or guarding, bowel sounds noted throughout abdomen GU:no cva tenderness NEURO: Pt is awake/alert/appropriate, moves all extremitiesx4.  No facial droop.  No arm or leg drift EXTREMITIES: pulses normal/equal, full ROM, no LE edema SKIN: warm, color normal PSYCH: no abnormalities of mood noted, alert and oriented to situation   ED Treatments / Results  Labs (all labs ordered are listed, but only abnormal results are displayed) Labs Reviewed  CBC - Abnormal; Notable for the following components:      Result Value   Hemoglobin 15.1 (*)    All other components within normal limits  URINALYSIS, ROUTINE W REFLEX MICROSCOPIC - Abnormal; Notable for the following components:   Color, Urine COLORLESS (*)    Specific Gravity, Urine 1.002 (*)    Leukocytes,Ua TRACE (*)    All other components within normal limits  BASIC METABOLIC PANEL    EKG EKG Interpretation  Date/Time:  Tuesday March 27 2019 20:10:01 EDT Ventricular Rate:  67 PR Interval:  172 QRS Duration: 76 QT Interval:  412 QTC Calculation: 435 R Axis:   46 Text Interpretation:  Normal sinus rhythm Normal ECG No significant change since last tracing Confirmed by Ripley Fraise [phone]) on 03/28/2019 12:00:37 AM   Radiology No results found.  Procedures Procedures    Medications Ordered in ED Medications  sodium chloride flush (NS) 0.9 % injection 3 mL (has no administration in time range)     Initial Impression / Assessment and Plan / ED Course  I have reviewed the triage vital signs and the nursing notes.  Pertinent labs   results that were available during my care of the patient were reviewed by me and considered in my medical decision making (see chart for details).         Patient presented with an episode of lightheadedness and feeling anxious earlier tonight.  Upon my evaluation her symptoms have resolved.  She feels at baseline.  She reports her blood pressure is elevated due to white coat syndrome.  She is back to baseline, she will be discharged.  She denies any chest pain. Plan to follow-up with cardiology as scheduled for her stress test Final Clinical Impressions(s) / ED Diagnoses   Final diagnoses:  Dizziness  Essential hypertension    ED Discharge Orders    None       Ripley Fraise, MD 03/28/19 0030

## 2019-03-28 NOTE — Discharge Instructions (Addendum)

## 2019-04-04 ENCOUNTER — Ambulatory Visit (INDEPENDENT_AMBULATORY_CARE_PROVIDER_SITE_OTHER): Payer: Medicare Other

## 2019-04-04 ENCOUNTER — Other Ambulatory Visit: Payer: Self-pay

## 2019-04-04 DIAGNOSIS — R9439 Abnormal result of other cardiovascular function study: Secondary | ICD-10-CM

## 2019-04-04 DIAGNOSIS — R9431 Abnormal electrocardiogram [ECG] [EKG]: Secondary | ICD-10-CM | POA: Diagnosis not present

## 2019-04-10 ENCOUNTER — Ambulatory Visit (INDEPENDENT_AMBULATORY_CARE_PROVIDER_SITE_OTHER): Payer: Medicare Other

## 2019-04-10 ENCOUNTER — Other Ambulatory Visit: Payer: Self-pay

## 2019-04-10 DIAGNOSIS — I358 Other nonrheumatic aortic valve disorders: Secondary | ICD-10-CM | POA: Diagnosis not present

## 2019-04-10 DIAGNOSIS — I209 Angina pectoris, unspecified: Secondary | ICD-10-CM | POA: Diagnosis not present

## 2019-04-17 ENCOUNTER — Encounter: Payer: Self-pay | Admitting: Cardiology

## 2019-04-17 ENCOUNTER — Ambulatory Visit (INDEPENDENT_AMBULATORY_CARE_PROVIDER_SITE_OTHER): Payer: Medicare Other | Admitting: Cardiology

## 2019-04-17 ENCOUNTER — Other Ambulatory Visit: Payer: Self-pay

## 2019-04-17 VITALS — BP 147/84 | HR 74 | Ht 63.0 in | Wt 146.0 lb

## 2019-04-17 DIAGNOSIS — I10 Essential (primary) hypertension: Secondary | ICD-10-CM

## 2019-04-17 DIAGNOSIS — R0789 Other chest pain: Secondary | ICD-10-CM | POA: Diagnosis not present

## 2019-04-17 DIAGNOSIS — K219 Gastro-esophageal reflux disease without esophagitis: Secondary | ICD-10-CM | POA: Diagnosis not present

## 2019-04-17 DIAGNOSIS — E7800 Pure hypercholesterolemia, unspecified: Secondary | ICD-10-CM

## 2019-04-17 MED ORDER — METOPROLOL SUCCINATE ER 25 MG PO TB24: 25.0000 mg | ORAL_TABLET | Freq: Every day | ORAL | 3 refills | Status: DC

## 2019-04-17 NOTE — Progress Notes (Signed)
Primary Physician:  Merrilee Seashore, MD   Patient ID: Emily [name], female    DOB: [birth year], 73 y.o.   MRN: [id number]  Subjective:    Chief Complaint  Patient presents with  . Chest Pain    follow up tests    HPI: JENYFER TRAWICK  is a 73 y.o. female  with hyperlipidemia, family history of CAD underwent treadmill stress test in our office in Feb 2020 that was abnormal due to inability to reach target heart rate, underwent nuclear stress test and now here for follow up.   She was started on metoprolol at her last visit. She has not had any further episodes of chest discomfort. She is overall feeling well. States that she has been evaluated by GI and is scheduled for endoscopy in the next few weeks.   States that her blood pressure elevated at physician offices, but is normal at home.   No former tobacco use. She does have family history of CAD in her mother, and 2 older siblings. None at earlier age. She does not exercise, but is fairly active.   Past Medical History:  Diagnosis Date  . Arthritis   . GERD (gastroesophageal reflux disease)   . Headache(784.0)   . Hypercholesterolemia   . Kidney stone 11-17-2009  passed on own  . Right shoulder pain    at night    Past Surgical History:  Procedure Laterality Date  . colonscopy  11/17/2000  . TOTAL HIP ARTHROPLASTY  11/15/2011   Procedure: TOTAL HIP ARTHROPLASTY;  Surgeon: Gearlean Alf, MD;  Location: [facility];  Service: Orthopedics;  Laterality: Left;    Social History   Socioeconomic History  . Marital status: Married    Spouse name: Not on file  . Number of children: 1  . Years of education: Not on file  . Highest education level: Not on file  Occupational History  . Not on file  Social Needs  . Financial resource strain: Not on file  . Food insecurity    Worry: Not on file    Inability: Not on file  . Transportation needs    Medical: Not on file    Non-medical: Not on file  Tobacco Use  . Smoking  status: Never Smoker  . Smokeless tobacco: Never Used  Substance and Sexual Activity  . Alcohol use: No  . Drug use: No  . Sexual activity: Not on file  Lifestyle  . Physical activity    Days per week: Not on file    Minutes per session: Not on file  . Stress: Not on file  Relationships  . Social Herbalist on phone: Not on file    Gets together: Not on file    Attends religious service: Not on file    Active member of club or organization: Not on file    Attends meetings of clubs or organizations: Not on file    Relationship status: Not on file  . Intimate partner violence    Fear of current or ex partner: Not on file    Emotionally abused: Not on file    Physically abused: Not on file    Forced sexual activity: Not on file  Other Topics Concern  . Not on file  Social History Narrative  . Not on file    Review of Systems  Constitution: Negative for decreased appetite, malaise/fatigue, weight gain and weight loss.  Eyes: Negative for visual disturbance.  Cardiovascular: Negative  for chest pain, claudication, dyspnea on exertion, leg swelling, orthopnea, palpitations and syncope.  Respiratory: Negative for hemoptysis and wheezing.   Endocrine: Negative for cold intolerance and heat intolerance.  Hematologic/Lymphatic: Does not bruise/bleed easily.  Skin: Negative for nail changes.  Musculoskeletal: Negative for muscle weakness and myalgias.  Gastrointestinal: Positive for dysphagia. Negative for abdominal pain, change in bowel habit, nausea and vomiting.  Neurological: Negative for difficulty with concentration, dizziness, focal weakness and headaches.  Psychiatric/Behavioral: Negative for altered mental status and suicidal ideas.  All other systems reviewed and are negative.     Objective:  Blood pressure (!) 147/84, pulse 74, height '5\' 3"'$  (1.6 m), weight 146 lb (66.2 kg), SpO2 99 %. Body mass index is 25.86 kg/m.    Physical Exam  Constitutional: She is  oriented to person, place, and time. Vital signs are normal. She appears well-developed and well-nourished.  HENT:  Head: Normocephalic and atraumatic.  Neck: Normal range of motion.  Cardiovascular: Normal rate, regular rhythm and intact distal pulses.  Murmur heard.  Early systolic murmur is present with a grade of 1/6 at the upper right sternal border. Pulmonary/Chest: Effort normal and breath sounds normal. No accessory muscle usage. No respiratory distress.  Abdominal: Soft. Bowel sounds are normal.  Musculoskeletal: Normal range of motion.  Neurological: She is alert and oriented to person, place, and time.  Skin: Skin is warm and dry.  Vitals reviewed.  Radiology: No results found.  Laboratory examination:   09/14/2018: CBC normal. Potassium 4.3, Creatinine 0.83, eGFR 71/81, Sodium 147, CMP otherwise normal. Cholesterol 267, triglycerides 115, HDL 83, LDL 116.  CMP Latest Ref Rng & Units 03/27/2019 02/19/2015 11/18/2011  Glucose 70 - 99 mg/dL 88 136(H) 118(H)  BUN 8 - 23 mg/dL '8 9 7  '$ Creatinine 0.44 - 1.00 mg/dL 0.88 0.87 0.75  Sodium 135 - 145 mmol/L 139 139 136  Potassium 3.5 - 5.1 mmol/L 3.6 3.2(L) 4.2  Chloride 98 - 111 mmol/L 105 107 102  CO2 22 - 32 mmol/L '25 23 28  '$ Calcium 8.9 - 10.3 mg/dL 9.8 9.7 9.3  Total Protein 6.5 - 8.1 g/dL - 6.6 -  Total Bilirubin 0.3 - 1.2 mg/dL - 0.5 -  Alkaline Phos 38 - 126 U/L - 64 -  AST 15 - 41 U/L - 23 -  ALT 14 - 54 U/L - 18 -   CBC Latest Ref Rng & Units 03/27/2019 02/19/2015 11/18/2011  WBC 4.0 - 10.5 K/uL 8.5 7.3 11.2(H)  Hemoglobin 12.0 - 15.0 g/dL 15.1(H) 14.2 10.5(L)  Hematocrit 36.0 - 46.0 % 44.6 41.6 30.2(L)  Platelets 150 - 400 K/uL 287 262 201   Lipid Panel  No results found for: CHOL, TRIG, HDL, CHOLHDL, VLDL, LDLCALC, LDLDIRECT HEMOGLOBIN A1C No results found for: HGBA1C, MPG TSH No results for input(s): TSH in the last 8760 hours.  PRN Meds:. Medications Discontinued During This Encounter  Medication Reason  .  Rosuvastatin Calcium 20 MG CPSP Duplicate   Current Meds  Medication Sig  . amLODipine (NORVASC) 2.5 MG tablet Take 2.5 mg by mouth daily.  . Cholecalciferol (VITAMIN D3) 1.25 MG (50000 UT) TABS Take by mouth daily.  Marland Kitchen esomeprazole (NEXIUM) 40 MG capsule Take 40 mg by mouth daily at 12 noon.  . metoprolol succinate (TOPROL XL) 25 MG 24 hr tablet Take 1 tablet (25 mg total) by mouth daily.  . nitroGLYCERIN (NITROSTAT) 0.4 MG SL tablet Place 1 tablet (0.4 mg total) under the tongue every 5 (five) minutes as  needed for chest pain.  . [DISCONTINUED] Rosuvastatin Calcium 20 MG CPSP Take 20 mg by mouth daily at 12 noon.     Cardiac Studies:   Echo- 04/10/2019 1. Normal LV systolic function with EF 71%. Left ventricle cavity is normal in size. Normal global wall motion. Normal diastolic filling pattern. Calculated EF 71%. 2. Trileaflet aortic valve with no regurgitation noted. Mild aortic valve leaflet calcification. 3. Mild (Grade I) mitral regurgitation. 4. Mild tricuspid regurgitation. No evidence of pulmonary hypertension.  Lexiscan Myoview Stress Test 04/04/2019: Stress EKG is non-diagnostic, as this is pharmacological stress test. Myocardial perfusion imaging is normal. Left ventricular ejection fraction is  79% with normal wall motion. Low risk study.  Treadmill exercise stress test 10/23/2018: Indication: chest tightness The patient exercised on Bruce protocol for 03:19 min. Patient achieved  5.01 METS and reached HR  126 bpm, which is  85 % of maximum age-predicted HR.  Stress test terminated due to hip pain.  Target HR is barely reached and could not be sustained for >1 minute.  Resting EKG demonstrates Normal sinus rhythm. ST Changes: With peak exercise there was no ST-T changes of ischemia. Arrhythmias: none. BP Response to Exercise: Resting hypertension- appropriate response. Exercise capacity was below average for age. Recommendations:  Consider  pharmacologic stress if  clinically indicated.  Stress test is equivocal for ischemia due to inability to achieve THR (unable to sustain HR). Clinical correlation recommended.   Assessment:     ICD-10-CM   1. Atypical chest pain  R07.89   2. Pure hypercholesterolemia  E78.00   3. Gastroesophageal reflux disease without esophagitis  K21.9   4. Essential hypertension  I10     EKG 03/15/2019: Normal sinus rhythm at 79 bpm, normal axis, no evidence of ischemia. Normal EKG.   Recommendations:   I have discussed recently obtained lexiscan nuclear stress test results with the patient, low risk stress test. Normal LVEF by echocardiogram. She has not had recurrence of chest pain since last seen by [place]. I have encouraged her to continue with workup from GI as well as continued aggressive risk factor modification. Blood pressure has improved, would recommend continuing with metoprolol. She will continue to follow up with her PCP for management of lipids. I have urged her to contact us if GI evaluation is unyielding and if she has recurrent symptoms. Otherwise, we will see her back on a as needed basis.    Miquel Dunn, MSN, APRN, FNP-C [facility]. [place] Office: [phone] Fax: [phone]

## 2019-04-22 ENCOUNTER — Encounter: Payer: Self-pay | Admitting: Cardiology

## 2019-10-01 ENCOUNTER — Other Ambulatory Visit: Payer: Self-pay | Admitting: Cardiology

## 2020-03-17 ENCOUNTER — Other Ambulatory Visit: Payer: Self-pay | Admitting: Cardiology

## 2020-08-03 ENCOUNTER — Emergency Department (HOSPITAL_COMMUNITY): Payer: Medicare PPO

## 2020-08-03 ENCOUNTER — Observation Stay (HOSPITAL_COMMUNITY): Payer: Medicare PPO

## 2020-08-03 ENCOUNTER — Other Ambulatory Visit: Payer: Self-pay

## 2020-08-03 ENCOUNTER — Encounter (HOSPITAL_COMMUNITY): Payer: Self-pay | Admitting: Emergency Medicine

## 2020-08-03 ENCOUNTER — Observation Stay (HOSPITAL_COMMUNITY)
Admission: EM | Admit: 2020-08-03 | Discharge: 2020-08-03 | Disposition: A | Discharge status: Home / Self Care | Payer: Medicare PPO | Attending: Internal Medicine | Admitting: Internal Medicine

## 2020-08-03 DIAGNOSIS — Z79899 Other long term (current) drug therapy: Secondary | ICD-10-CM | POA: Diagnosis not present

## 2020-08-03 DIAGNOSIS — G459 Transient cerebral ischemic attack, unspecified: Principal | ICD-10-CM | POA: Diagnosis present

## 2020-08-03 DIAGNOSIS — Z20822 Contact with and (suspected) exposure to covid-19: Secondary | ICD-10-CM | POA: Insufficient documentation

## 2020-08-03 DIAGNOSIS — R079 Chest pain, unspecified: Secondary | ICD-10-CM

## 2020-08-03 DIAGNOSIS — I10 Essential (primary) hypertension: Secondary | ICD-10-CM | POA: Diagnosis not present

## 2020-08-03 DIAGNOSIS — R200 Anesthesia of skin: Secondary | ICD-10-CM

## 2020-08-03 DIAGNOSIS — E785 Hyperlipidemia, unspecified: Secondary | ICD-10-CM

## 2020-08-03 DIAGNOSIS — E876 Hypokalemia: Secondary | ICD-10-CM

## 2020-08-03 DIAGNOSIS — R202 Paresthesia of skin: Secondary | ICD-10-CM | POA: Diagnosis present

## 2020-08-03 HISTORY — DX: Essential (primary) hypertension: I10

## 2020-08-03 LAB — CBC
HCT: 43.3 % (ref 36.0–46.0)
Hemoglobin: 14.3 g/dL (ref 12.0–15.0)
MCH: 30.5 pg (ref 26.0–34.0)
MCHC: 33 g/dL (ref 30.0–36.0)
MCV: 92.3 fL (ref 80.0–100.0)
Platelets: 251 10*3/uL (ref 150–400)
RBC: 4.69 MIL/uL (ref 3.87–5.11)
RDW: 13.2 % (ref 11.5–15.5)
WBC: 6.7 10*3/uL (ref 4.0–10.5)
nRBC: 0 % (ref 0.0–0.2)

## 2020-08-03 LAB — RAPID URINE DRUG SCREEN, HOSP PERFORMED
Amphetamines: NOT DETECTED
Barbiturates: NOT DETECTED
Benzodiazepines: NOT DETECTED
Cocaine: NOT DETECTED
Opiates: NOT DETECTED
Tetrahydrocannabinol: NOT DETECTED

## 2020-08-03 LAB — DIFFERENTIAL
Abs Immature Granulocytes: 0.01 10*3/uL (ref 0.00–0.07)
Basophils Absolute: 0.1 10*3/uL (ref 0.0–0.1)
Basophils Relative: 1 %
Eosinophils Absolute: 0.2 10*3/uL (ref 0.0–0.5)
Eosinophils Relative: 3 %
Immature Granulocytes: 0 %
Lymphocytes Relative: 32 %
Lymphs Abs: 2.2 10*3/uL (ref 0.7–4.0)
Monocytes Absolute: 0.8 10*3/uL (ref 0.1–1.0)
Monocytes Relative: 11 %
Neutro Abs: 3.5 10*3/uL (ref 1.7–7.7)
Neutrophils Relative %: 53 %

## 2020-08-03 LAB — I-STAT CHEM 8, ED
BUN: 9 mg/dL (ref 8–23)
Calcium, Ion: 1.22 mmol/L (ref 1.15–1.40)
Chloride: 102 mmol/L (ref 98–111)
Creatinine, Ser: 0.9 mg/dL (ref 0.44–1.00)
Glucose, Bld: 98 mg/dL (ref 70–99)
HCT: 43 % (ref 36.0–46.0)
Hemoglobin: 14.6 g/dL (ref 12.0–15.0)
Potassium: 3 mmol/L — ABNORMAL LOW (ref 3.5–5.1)
Sodium: 142 mmol/L (ref 135–145)
TCO2: 27 mmol/L (ref 22–32)

## 2020-08-03 LAB — HEMOGLOBIN A1C
Hgb A1c MFr Bld: 5.6 % (ref 4.8–5.6)
Mean Plasma Glucose: 114.02 mg/dL

## 2020-08-03 LAB — URINALYSIS, ROUTINE W REFLEX MICROSCOPIC
Bilirubin Urine: NEGATIVE
Glucose, UA: NEGATIVE mg/dL
Hgb urine dipstick: NEGATIVE
Ketones, ur: NEGATIVE mg/dL
Leukocytes,Ua: NEGATIVE
Nitrite: NEGATIVE
Protein, ur: NEGATIVE mg/dL
Specific Gravity, Urine: 1.004 — ABNORMAL LOW (ref 1.005–1.030)
pH: 9 — ABNORMAL HIGH (ref 5.0–8.0)

## 2020-08-03 LAB — COMPREHENSIVE METABOLIC PANEL
ALT: 13 U/L (ref 0–44)
AST: 18 U/L (ref 15–41)
Albumin: 4.2 g/dL (ref 3.5–5.0)
Alkaline Phosphatase: 64 U/L (ref 38–126)
Anion gap: 10 (ref 5–15)
BUN: 10 mg/dL (ref 8–23)
CO2: 27 mmol/L (ref 22–32)
Calcium: 9.7 mg/dL (ref 8.9–10.3)
Chloride: 104 mmol/L (ref 98–111)
Creatinine, Ser: 0.83 mg/dL (ref 0.44–1.00)
GFR, Estimated: >60 mL/min (ref >60)
Glucose, Bld: 98 mg/dL (ref 70–99)
Potassium: 2.9 mmol/L — ABNORMAL LOW (ref 3.5–5.1)
Sodium: 141 mmol/L (ref 135–145)
Total Bilirubin: 0.7 mg/dL (ref 0.3–1.2)
Total Protein: 7.1 g/dL (ref 6.5–8.1)

## 2020-08-03 LAB — RESP PANEL BY RT-PCR (FLU A&B, COVID) ARPGX2
Influenza A by PCR: NEGATIVE
Influenza B by PCR: NEGATIVE
SARS Coronavirus 2 by RT PCR: NEGATIVE

## 2020-08-03 LAB — LIPID PANEL
Cholesterol: 332 mg/dL — ABNORMAL HIGH (ref 0–200)
HDL: 78 mg/dL (ref >40)
LDL Cholesterol: 237 mg/dL — ABNORMAL HIGH (ref 0–99)
Total CHOL/HDL Ratio: 4.3 RATIO
Triglycerides: 85 mg/dL (ref <150)
VLDL: 17 mg/dL (ref 0–40)

## 2020-08-03 LAB — APTT: aPTT: 30 seconds (ref 24–36)

## 2020-08-03 LAB — TROPONIN I (HIGH SENSITIVITY)
Troponin I (High Sensitivity): <2 ng/L (ref <18)
Troponin I (High Sensitivity): <2 ng/L (ref <18)

## 2020-08-03 LAB — PROTIME-INR
INR: 1 (ref 0.8–1.2)
Prothrombin Time: 12.8 seconds (ref 11.4–15.2)

## 2020-08-03 LAB — MAGNESIUM: Magnesium: 2.2 mg/dL (ref 1.7–2.4)

## 2020-08-03 MED ORDER — METOPROLOL SUCCINATE ER 50 MG PO TB24
25.0000 mg | ORAL_TABLET | Freq: Every day | ORAL | Status: DC
  Administered 2020-08-03: 25 mg via ORAL
  Filled 2020-08-03: qty 1

## 2020-08-03 MED ORDER — CLOPIDOGREL BISULFATE 300 MG PO TABS
300.0000 mg | ORAL_TABLET | Freq: Once | ORAL | Status: AC
  Administered 2020-08-03: 300 mg via ORAL
  Filled 2020-08-03: qty 1

## 2020-08-03 MED ORDER — ACETAMINOPHEN 325 MG PO TABS
650.0000 mg | ORAL_TABLET | ORAL | Status: DC | PRN
  Administered 2020-08-03: 650 mg via ORAL
  Filled 2020-08-03: qty 2

## 2020-08-03 MED ORDER — ASPIRIN 325 MG PO TABS
325.0000 mg | ORAL_TABLET | Freq: Once | ORAL | Status: AC
  Administered 2020-08-03: 325 mg via ORAL
  Filled 2020-08-03: qty 1

## 2020-08-03 MED ORDER — ENOXAPARIN SODIUM 40 MG/0.4ML ~~LOC~~ SOLN
40.0000 mg | SUBCUTANEOUS | Status: DC
  Administered 2020-08-03: 40 mg via SUBCUTANEOUS
  Filled 2020-08-03: qty 0.4

## 2020-08-03 MED ORDER — POTASSIUM CHLORIDE CRYS ER 20 MEQ PO TBCR
40.0000 meq | EXTENDED_RELEASE_TABLET | Freq: Once | ORAL | Status: AC
  Administered 2020-08-03: 40 meq via ORAL
  Filled 2020-08-03: qty 2

## 2020-08-03 MED ORDER — ROSUVASTATIN CALCIUM 20 MG PO TABS
20.0000 mg | ORAL_TABLET | Freq: Every day | ORAL | Status: DC
  Administered 2020-08-03: 20 mg via ORAL
  Filled 2020-08-03: qty 1

## 2020-08-03 MED ORDER — IOHEXOL 350 MG/ML SOLN
80.0000 mL | Freq: Once | INTRAVENOUS | Status: AC | PRN
  Administered 2020-08-03: 80 mL via INTRAVENOUS

## 2020-08-03 MED ORDER — ASPIRIN 300 MG RE SUPP: 300.0000 mg | Freq: Once | RECTAL | Status: AC

## 2020-08-03 MED ORDER — STROKE: EARLY STAGES OF RECOVERY BOOK
Freq: Once | Status: DC
  Filled 2020-08-03: qty 1

## 2020-08-03 MED ORDER — CLOPIDOGREL BISULFATE 75 MG PO TABS: 75.0000 mg | ORAL_TABLET | Freq: Every day | ORAL | 0 refills | Status: AC

## 2020-08-03 MED ORDER — ASPIRIN EC 81 MG PO TBEC: 81.0000 mg | DELAYED_RELEASE_TABLET | Freq: Every day | ORAL | Status: DC

## 2020-08-03 MED ORDER — CLOPIDOGREL BISULFATE 75 MG PO TABS: 75.0000 mg | ORAL_TABLET | Freq: Every day | ORAL | Status: DC

## 2020-08-03 MED ORDER — HYDRALAZINE HCL 20 MG/ML IJ SOLN
10.0000 mg | Freq: Once | INTRAMUSCULAR | Status: AC
  Administered 2020-08-03: 10 mg via INTRAVENOUS
  Filled 2020-08-03: qty 1

## 2020-08-03 MED ORDER — ASPIRIN 81 MG PO TBEC: 81.0000 mg | DELAYED_RELEASE_TABLET | Freq: Every day | ORAL | 11 refills | Status: AC

## 2020-08-03 MED ORDER — ACETAMINOPHEN 160 MG/5ML PO SOLN: 650.0000 mg | ORAL | Status: DC | PRN

## 2020-08-03 MED ORDER — ACETAMINOPHEN 650 MG RE SUPP: 650.0000 mg | RECTAL | Status: DC | PRN

## 2020-08-03 MED ORDER — ONDANSETRON 4 MG PO TBDP
4.0000 mg | ORAL_TABLET | Freq: Three times a day (TID) | ORAL | Status: DC | PRN
  Administered 2020-08-03: 4 mg via ORAL
  Filled 2020-08-03: qty 1

## 2020-08-03 NOTE — Discharge Summary (Signed)
Patient admitted early morning hours with transient neurological symptoms.  Has history of hypertension and hyperlipidemia.  Stayed in the emergency room and completed her stroke work-up.  Going home today.  Possible right lacunar TIA: CT scan negative on arrival CTA with atherosclerotic changes, MRI of the brain normal, chronic microvascular changes Echocardiogram completed, LDL more than 200, Crestor 20 mg daily Not on any antiplatelet before coming to the hospital, Plavix 300 mg load, aspirin 325 mg given Recommended continue Plavix 75 mg daily for 21 days, aspirin 81 mg daily to continue lifelong. Resume blood pressure medications at home, she has not taken blood pressure medications for last few days.  Patient is without any neurological deficits in the emergency room.  She is going home today.  Referral sent to outpatient neurology for follow-up.

## 2020-08-03 NOTE — Progress Notes (Signed)
OT Cancellation Note  Patient Details Name: Emily [name] MRN: [id number] DOB: [birth year]   Cancelled Treatment:    Reason Eval/Treat Not Completed: Other (comment). Per RN Patient to be transferring to [facility] when bed available.   Emily [name] L Trystan Eads 08/03/2020, 9:33 AM

## 2020-08-03 NOTE — ED Notes (Signed)
Patient complaining of nausea. MD notified, Zofran ordered and diet tray ordered. Called pharmacy to ask for medication to be approved

## 2020-08-03 NOTE — Progress Notes (Signed)
Patient seen and examined.  Admitted early morning hours by nighttime hospitalist when patient woke up at 1 AM with tingling and numbness of the left upper arm and leg.  She had 2 subsequent episodes very short lasting with symptoms already improving before EMS arrived. Had no neurological deficit on arrival. Initial CT scan was negative.  Plan: TIA work-up in progress. MRI of the brain with no evidence of acute stroke. Patient currently asymptomatic, no neurological deficit, walking around without difficulty. CTA of the head and neck with distal left M1 stenosis, probably too distal to intervene. A1c and LDL pending. Echocardiogram done, results are pending.  I discussed with neurology, they will see patient.  Patient was admitted to [facility].  However due to difficulty in procuring a bed and patient's stability, potential discharge from the emergency room with outpatient neurology follow-up. will await neurology consultation.

## 2020-08-03 NOTE — Consult Note (Signed)
Neurology Consultation Reason for Consult: Transient Left-sided numbness Referring Physician: Dr. Dorcas Carrow   CC: Left sided pain / numbness   History is obtained from: Patient and chart review, husband at bedside  HPI: Emily [name] is a 74 y.o. female with a past medical history significant for hypertension, hyperlipidemia, presenting with transient neurological symptoms.  In brief, she has been very busy with Thanksgiving and has not been taking her medications for a couple of days at least.  She had fallen asleep watching a movie and shortly after waking had pain in her right arm that gradually moved down towards her wrist.  She had some change in sensation on that side.  The progression of her symptoms and the resolution of her symptoms each took about a minute thus the entire episode was about 2 minutes.  5 minutes later she had very similar feeling in her leg starting at her knee and going down to her ankle.  Associated that this was feeling nervous and faint although she denies diaphoresis or anxiety, racing heart rate, but describes it just as a "overwhelming" feeling that is difficult to describe.  She denies headache or any other focal neurological symptoms.  She denies any change in her bowel or bladder any blood in her urine or stool or any change when she wipes.  She denies any recent trauma or pain in her neck, fevers, chills or significant unexplained weight changes.  She does report that she had some chest pain about a year ago for which cardiac work-up was fairly unremarkable.  She did say that her primary care physician told her she should stop her statin back in July and she has not been taking that.  She denies eating excessive salt over Thanksgiving reporting that she did more of the cooking than the eating.  Initially in the ED she had some dizziness with ambulation, but this is resolved.  She has no other symptoms and feels completely back to baseline which is  corroborated by her husband at bedside  LKW: 1:30 AM on 11/28  tPA given?: No due to symptoms fully resolved  Premorbid modified rankin scale:      0 - No symptoms.  ROS: A 14 point ROS was performed and is negative except as noted in the HPI.   Past Medical History:  Diagnosis Date  . Arthritis   . GERD (gastroesophageal reflux disease)   . Headache(784.0)   . Hypercholesterolemia   . Hypertension   . Kidney stone November 29, 2009  passed on own  . Right shoulder pain    at night   Family History  Problem Relation Age of Onset  . Heart attack Mother   . Heart attack Sister   . Heart attack Brother     Social History:  reports that she has never smoked. She has never used smokeless tobacco. She reports that she does not drink alcohol and does not use drugs.   Exam: Current vital signs: BP 120/82   Pulse 65   Temp 98.2 F (36.8 C) (Oral)   Resp 18   Ht 5\' 3"  (1.6 m)   Wt 62.6 kg   SpO2 96%   BMI 24.45 kg/m  Vital signs in last 24 hours: Temp:  [98.2 F (36.8 C)] 98.2 F (36.8 C) (11/28 0357) Pulse Rate:  [64-98] 65 (11/28 1300) Resp:  [15-21] 18 (11/28 1300) BP: (119-196)/(74-117) 120/82 (11/28 1300) SpO2:  [96 %-100 %] 96 % (11/28 1300) Weight:  [62.6 kg]  62.6 kg (11/28 0356)   Physical Exam  Constitutional: Appears well-developed and well-nourished.  Psych: Affect appropriate to situation Eyes: No scleral injection HENT: No OP obstruction, fair dentition MSK: no joint deformities.  Cardiovascular: Normal rate and regular rhythm.  Respiratory: Effort normal, non-labored breathing GI: Soft.  No distension. There is no tenderness.  Skin: WDI  Neuro: Mental Status: Patient is awake, alert, oriented to person, place, month, year, and situation. Patient is able to give a clear and coherent history. No signs of aphasia or neglect Cranial Nerves: II: Visual Fields are full. Pupils are equal, round, and reactive to light.   III,IV, VI: EOMI without ptosis or  diploplia.  V: Facial sensation is symmetric to temperature VII: Facial movement is symmetric.  VIII: hearing is intact to voice X: Uvula elevates symmetrically XI: Shoulder shrug is symmetric. XII: tongue is midline without atrophy or fasciculations.  Motor: Tone is normal. Bulk is normal. 5/5 strength was present in all four extremities.  Sensory: Sensation is symmetric to light touch and temperature in the arms and legs. Deep Tendon Reflexes: 2+ and symmetric in the biceps and patellae.  Plantars: Toes are downgoing bilaterally.  Cerebellar: FNF and HKS are intact bilaterally      I have reviewed labs in epic and the results pertinent to this consultation are:  CBC    Component Value Date/Time   WBC 6.7 08/03/2020 0412   RBC 4.69 08/03/2020 0412   HGB 14.6 08/03/2020 0427   HCT 43.0 08/03/2020 0427   PLT 251 08/03/2020 0412   MCV 92.3 08/03/2020 0412   MCH 30.5 08/03/2020 0412   MCHC 33.0 08/03/2020 0412   RDW 13.2 08/03/2020 0412   LYMPHSABS 2.2 08/03/2020 0412   MONOABS 0.8 08/03/2020 0412   EOSABS 0.2 08/03/2020 0412   BASOSABS 0.1 08/03/2020 0412   Last metabolic panel Lab Results  Component Value Date   GLUCOSE 98 08/03/2020   NA 142 08/03/2020   K 3.0 (L) 08/03/2020   CL 102 08/03/2020   CO2 27 08/03/2020   BUN 9 08/03/2020   CREATININE 0.90 08/03/2020   GFRNONAA >60 08/03/2020   GFRAA >60 03/27/2019   CALCIUM 9.7 08/03/2020   PROT 7.1 08/03/2020   ALBUMIN 4.2 08/03/2020   BILITOT 0.7 08/03/2020   ALKPHOS 64 08/03/2020   AST 18 08/03/2020   ALT 13 08/03/2020   ANIONGAP 10 08/03/2020     Lab Results  Component Value Date   CHOL 332 (H) 08/03/2020   HDL 78 08/03/2020   LDLCALC 237 (H) 08/03/2020   TRIG 85 08/03/2020   CHOLHDL 4.3 08/03/2020    No results found for: HGBA1C   I have reviewed the images obtained: MRI brain negative for acute stroke, mild chronic microvascular disease  CTA with some intracranial athero but no clearly  culprit lesion for these symptoms  Impression: This is a 74 year old woman presenting with transient neurological symptoms.  As expected based on the very brief duration of her symptoms, her MRI is negative.  Additionally the description of spreading symptoms slowly over the course of the minute are somewhat atypical for an ischemic event. Hypertensive emergency is another consideration given she had not been taking her antihypertensives for at least a couple of days and her blood pressures were quite high on arrival. Nevertheless given her risk factors, her significant atherosclerosis on CTA, will plan dual antiplatelet therapy for 21-day course, resume her statin, and plan for close outpatient follow-up.  Given echo a year ago  was fairly unremarkable and she denies any current cardiac symptoms, feel that it is safe for her echo to be followed up on an outpatient basis.    Recommendations: # Possible right lacunar TIA  - Stroke labs HgbA1c pending to be followed up outpatient, fasting lipid panel with uncontrolled LDL as above - MRI brain completed as above  - CTA completed as above - Echocardiogram completed, read to be followed up outpatient - Prophylactic therapy-Antiplatelet med: Aspirin - dose 325mg  PO completed, followed by 81 mg daily - Plavix 300 mg load with 75 mg daily for 21 day course  - Resume prior Crestor, consider PSK9 inhibitor if LDL remains > 70 - Risk factor modification; patient counseled on medication adherence  - 30 day event monitor to be arranged by PCP - Blood pressure goal   - Gradually progress to normotension (meds to be titrated by PCP as needed) - PT consult, OT consult, Speech consult, not needed as patient is back to baseline - Outpatient stroke follow-up ordered   MD-PhD [facility] [phone]

## 2020-08-03 NOTE — ED Notes (Signed)
Pt given meal tray.

## 2020-08-03 NOTE — ED Notes (Signed)
Pt ambulating to rest room.

## 2020-08-03 NOTE — Progress Notes (Signed)
SLP Cancellation Note  Patient Details Name: Emily [name] MRN: [id number] DOB: 1946-07-01   Cancelled treatment:       Reason Eval/Treat Not Completed: Other (comment)  SLP orders received for cognitive-linguistic evaluation. Per nursing and PT/OT, pt is to transfer to [facility] for further care as soon as bed is available. Evaluation deferred; to be completed if indicated after transfer.  Emily [name], M.S., CCC-SLP Speech-Language Pathologist Acute Rehabilitation Services Pager: [phone]  Emily [name] Emily [name] 08/03/2020, 2:56 PM

## 2020-08-03 NOTE — ED Notes (Signed)
Patient transported to CT 

## 2020-08-03 NOTE — ED Notes (Signed)
Patient transported to MRI 

## 2020-08-03 NOTE — H&P (Signed)
History and Physical    Emily [name] KGM:[id number] DOB: 03/12/1946 DOA: 08/03/2020  PCP: Georgianne Fick, MD Patient coming from: Home  Chief Complaint: Left arm and leg numbness  HPI: Emily [name] is a 74 y.o. female with medical history significant of hypertension, hyperlipidemia, GERD presenting with complaints of left arm and leg numbness which resolved prior to ED arrival.  Patient states around 1 AM this morning she woke up because she had pain in her left upper arm.  Soon after waking up she noticed that her left arm and left leg became numb.  No weakness in her arm or leg.  No difficulty with speech or drooping of the face.  She also experienced substernal chest discomfort at that time which she describes as heaviness.  No associated nausea, diaphoresis, or dyspnea.  States symptoms resolved in a few minutes.  She checked her blood pressure at that time and it was high with systolic in the 160s and diastolic in the 110s.  She took her home amlodipine and her blood pressure improved.  About 20 minutes later she experienced the same symptoms and they again resolved in a few minutes.  Denies history of stroke.  She does not smoke cigarettes.  States her cardiologist is Dr. Jacinto Halim who did a stress test a year ago and she was told it was normal.   ED Course: Blood pressure elevated 196/117 on arrival.  Remainder of vital signs stable.  WBC 6.7, hemoglobin 14.3, hematocrit 43.3, platelet 251K.  Sodium 141, potassium 2.9, chloride 104, bicarb 27, BUN 10, creatinine 0.8, glucose 98.  INR 1.0.  UDS negative.  UA not suggestive of infection.  Blood ethanol level pending.  High-sensitivity troponin level pending.  Screening SARS-CoV-2 PCR test and influenza panel pending.  Head CT negative for acute intracranial abnormality.  Chest x-ray showing no active cardiopulmonary disease.  Patient was given IV hydralazine 10 mg and blood pressure improved.  ED provider discussed the case with Dr.  Amada Jupiter from neurology who recommended admission to [facility] for TIA/stroke work-up.  Review of Systems:  All systems reviewed and apart from history of presenting illness, are negative.  Past Medical History:  Diagnosis Date  . Arthritis   . GERD (gastroesophageal reflux disease)   . Headache(784.0)   . Hypercholesterolemia   . Hypertension   . Kidney stone 11-28-09  passed on own  . Right shoulder pain    at night    Past Surgical History:  Procedure Laterality Date  . colonscopy  2002  . TOTAL HIP ARTHROPLASTY  11/15/2011   Procedure: TOTAL HIP ARTHROPLASTY;  Surgeon: Loanne Drilling, MD;  Location: [facility]S;  Service: Orthopedics;  Laterality: Left;     reports that she has never smoked. She has never used smokeless tobacco. She reports that she does not drink alcohol and does not use drugs.  Allergies  Allergen Reactions  . Sulfa Antibiotics Other (See Comments)    Eye turned yellow    Family History  Problem Relation Age of Onset  . Heart attack Mother   . Heart attack Sister   . Heart attack Brother     Prior to Admission medications   Medication Sig Start Date End Date Taking? Authorizing Provider  amLODipine (NORVASC) 2.5 MG tablet Take 2.5 mg by mouth daily.    [provider]  amLODipine (NORVASC) 5 MG tablet Take 5 mg by mouth daily. 07/18/20   [provider]  Cholecalciferol (VITAMIN D3) 1.25  MG (50000 UT) TABS Take by mouth daily.    [provider]  esomeprazole (NEXIUM) 40 MG capsule Take 40 mg by mouth daily at 12 noon.    [provider]  metoprolol succinate (TOPROL-XL) 25 MG 24 hr tablet TAKE 1 TABLET BY MOUTH EVERY DAY 10/01/19   Toniann FailKelley, Ashton Haynes, NP  nitroGLYCERIN (NITROSTAT) 0.4 MG SL tablet Place 1 tablet (0.4 mg total) under the tongue every 5 (five) minutes as needed for chest pain. 03/15/19 06/13/19  Toniann FailKelley, Ashton Haynes, NP  rosuvastatin (CRESTOR) 20 MG tablet Take 1 tablet by mouth daily. 04/07/19    [provider]    Physical Exam: Vitals:   08/03/20 0357 08/03/20 0448 08/03/20 0500 08/03/20 0600  BP: (!) 196/117 (!) 192/96 (!) 164/90 (!) 152/94  Pulse: 87 79 82 98  Resp: 20  18 15   Temp: 98.2 F (36.8 C)     TempSrc: Oral     SpO2: 100% 98% 100% 100%  Weight:      Height:        Physical Exam Constitutional:      General: She is not in acute distress. HENT:     Head: Normocephalic and atraumatic.  Eyes:     Extraocular Movements: Extraocular movements intact.     Conjunctiva/sclera: Conjunctivae normal.  Cardiovascular:     Rate and Rhythm: Normal rate and regular rhythm.     Pulses: Normal pulses.  Pulmonary:     Effort: Pulmonary effort is normal. No respiratory distress.     Breath sounds: Normal breath sounds. No wheezing or rales.  Abdominal:     General: Bowel sounds are normal. There is no distension.     Palpations: Abdomen is soft.     Tenderness: There is no abdominal tenderness.  Musculoskeletal:     Cervical back: Normal range of motion and neck supple.     Left lower leg: Edema present.     Comments: Mild pedal edema (chronic per patient)  Skin:    General: Skin is warm and dry.  Neurological:     General: No focal deficit present.     Mental Status: She is alert and oriented to person, place, and time.     Sensory: No sensory deficit.     Motor: No weakness.     Labs on Admission: I have personally reviewed following labs and imaging studies  CBC: Recent Labs  Lab 08/03/20 0412 08/03/20 0427  WBC 6.7  --   NEUTROABS 3.5  --   HGB 14.3 14.6  HCT 43.3 43.0  MCV 92.3  --   PLT 251  --    Basic Metabolic Panel: Recent Labs  Lab 08/03/20 0412 08/03/20 0427  NA 141 142  K 2.9* 3.0*  CL 104 102  CO2 27  --   GLUCOSE 98 98  BUN 10 9  CREATININE 0.83 0.90  CALCIUM 9.7  --    GFR: Estimated Creatinine Clearance: 45.4 mL/min (by C-G formula based on SCr of 0.9 mg/dL). Liver Function Tests: Recent Labs  Lab  08/03/20 0412  AST 18  ALT 13  ALKPHOS 64  BILITOT 0.7  PROT 7.1  ALBUMIN 4.2   No results for input(s): LIPASE, AMYLASE in the last 168 hours. No results for input(s): AMMONIA in the last 168 hours. Coagulation Profile: Recent Labs  Lab 08/03/20 0412  INR 1.0   Cardiac Enzymes: No results for input(s): CKTOTAL, CKMB, CKMBINDEX, TROPONINI in the last 168 hours. BNP (last 3  results) No results for input(s): PROBNP in the last 8760 hours. HbA1C: No results for input(s): HGBA1C in the last 72 hours. CBG: No results for input(s): GLUCAP in the last 168 hours. Lipid Profile: No results for input(s): CHOL, HDL, LDLCALC, TRIG, CHOLHDL, LDLDIRECT in the last 72 hours. Thyroid Function Tests: No results for input(s): TSH, T4TOTAL, FREET4, T3FREE, THYROIDAB in the last 72 hours. Anemia Panel: No results for input(s): VITAMINB12, FOLATE, FERRITIN, TIBC, IRON, RETICCTPCT in the last 72 hours. Urine analysis:    Component Value Date/Time   COLORURINE STRAW (A) 08/03/2020 0412   APPEARANCEUR CLEAR 08/03/2020 0412   LABSPEC 1.004 (L) 08/03/2020 0412   PHURINE 9.0 (H) 08/03/2020 0412   GLUCOSEU NEGATIVE 08/03/2020 0412   HGBUR NEGATIVE 08/03/2020 0412   BILIRUBINUR NEGATIVE 08/03/2020 0412   KETONESUR NEGATIVE 08/03/2020 0412   PROTEINUR NEGATIVE 08/03/2020 0412   UROBILINOGEN 0.2 02/19/2015 2355   NITRITE NEGATIVE 08/03/2020 0412   LEUKOCYTESUR NEGATIVE 08/03/2020 0412    Radiological Exams on Admission: DG Chest 2 View  Result Date: 08/03/2020 CLINICAL DATA:  Palpitations and shortness of breath EXAM: CHEST - 2 VIEW COMPARISON:  11/08/2011 FINDINGS: The heart size and mediastinal contours are within normal limits. Both lungs are clear. The visualized skeletal structures are unremarkable. IMPRESSION: No active cardiopulmonary disease. Electronically Signed   By: Burman Nieves M.D.   On: 08/03/2020 05:04   CT HEAD WO CONTRAST  Result Date: 08/03/2020 CLINICAL DATA:  TIA  with transient left arm and leg numbness, since resolved. Elevated blood pressure. EXAM: CT HEAD WITHOUT CONTRAST TECHNIQUE: Contiguous axial images were obtained from the base of the skull through the vertex without intravenous contrast. COMPARISON:  None. FINDINGS: Brain: No evidence of acute infarction, hemorrhage, hydrocephalus, extra-axial collection or mass lesion/mass effect. Mild cerebral atrophy. Low-attenuation changes in the deep white matter consistent with small vessel ischemia. Vascular: Moderate intracranial arterial vascular calcifications. Skull: Calvarium appears intact. Sinuses/Orbits: Paranasal sinuses and mastoid air cells are clear. Other: None. IMPRESSION: 1. No acute intracranial abnormalities. 2. Mild cerebral atrophy and small vessel ischemia. Electronically Signed   By: Burman Nieves M.D.   On: 08/03/2020 04:41    EKG: Independently reviewed.  Sinus rhythm, no acute changes.  Assessment/Plan Principal Problem:   TIA (transient ischemic attack) Active Problems:   Hypokalemia   Essential hypertension   Hyperlipidemia   Chest pain   TIA versus possible stroke Patient is presenting with complaints of left arm and leg numbness which occurred twice since 1 AM this morning and each time resolved within a few minutes.  Neuro exam currently nonfocal.  Head CT negative for acute intracranial abnormality.  However, does have stroke risk factors. -Neurology recommended admission to [facility] for TIA/stroke work-up -MRI of the brain without contrast -2D echocardiogram -Hemoglobin A1c, fasting lipid panel -Aspirin 325 mg now.  Defer further recommendations regarding antiplatelet therapy to neurology. -Already on a high intensity statin Crestor 20 mg daily, resume after pharmacy med rec is done -Frequent neurochecks -PT, OT, speech therapy. -N.p.o. until cleared by bedside swallow evaluation or formal speech evaluation  Chest pain Now resolved.  EKG without acute ischemic  changes and high-sensitivity troponin negative.  Patient states her cardiologist Dr. Jacinto Halim did a stress test a year ago and she was told it was normal. -Cardiac monitoring, check second set of troponin  Hypokalemia Potassium 2.9.  Not endorsing vomiting or diarrhea.  Not on a diuretic. -Replete potassium.  Check magnesium level and replete if low.  Continue to monitor electrolytes.  Hypertension Blood pressure elevated on arrival and received hydralazine in the ED.  Systolic now improved to 150-160s. -Allow permissive hypertension at this time, brain MRI pending to rule out stroke  Hyperlipidemia -Resume statin after pharmacy med rec is done  GERD -Resume PPI after pharmacy med rec is done.  DVT prophylaxis: Lovenox Code Status: Full code Family Communication: Husband at bedside. Disposition Plan: Status is: Observation  The patient remains OBS appropriate and will d/c before 2 midnights.  Dispo: The patient is from: Home              Anticipated d/c is to: Home              Anticipated d/c date is: 2 days              Patient currently is not medically stable to d/c.  The medical decision making on this patient was of high complexity and the patient is at high risk for clinical deterioration, therefore this is a level 3 visit.  John Giovanni MD [facility]  If 7PM-7AM, please contact night-coverage [email or web address]  08/03/2020, 6:24 AM

## 2020-08-03 NOTE — ED Triage Notes (Signed)
Patient states she woke up about an hour ago and had left arm numbness that traveled down to her left leg that has now resolved. Patient checked pressure at home and is was 165/116. Hx hypertension.

## 2020-08-03 NOTE — ED Notes (Signed)
Lunch tray to room

## 2020-08-03 NOTE — ED Notes (Signed)
MD in room at this time.

## 2020-08-03 NOTE — Progress Notes (Signed)
  Echocardiogram 2D Echocardiogram has been performed.  Emily [name] 08/03/2020, 3:27 PM

## 2020-08-03 NOTE — Progress Notes (Signed)
PT Cancellation Note  Patient Details Name: SHAMONA WIRTZ MRN: [id number] DOB: 1945-12-24   Cancelled Treatment:     PT order received but eval deferred - RN advises that pt will be transferring to [facility].  PT to follow up on [facility] campus.   Osbaldo Mark 08/03/2020, 9:16 AM

## 2020-08-03 NOTE — ED Notes (Signed)
Zofran, saltine crackers and water provided to patient

## 2020-08-03 NOTE — Progress Notes (Signed)
  Echocardiogram 2D Echocardiogram was attempted but patient was in MRI. We will try again as our schedule permits.   Tye Savoy 08/03/2020, 1:26 PM

## 2020-08-03 NOTE — ED Notes (Signed)
Patient transported to CT ambulatory 

## 2020-08-03 NOTE — ED Provider Notes (Signed)
TIME SEEN: 4:10 AM  CHIEF COMPLAINT: Left-sided numbness  HPI: Patient is a 74 year old female with history of hypertension, hyperlipidemia who presents to the emergency department with left-sided arm and leg numbness.  States she fell asleep around 1 AM and felt normal.  Woke up at 1:30 AM and felt a discomfort in her left arm with numbness and tingling down the left arm and left leg.  States this episode lasted for several minutes and then resolved.  She then again had a another episode that lasted several minutes and resolved.  She is now asymptomatic other than feeling some "fluttering" in her chest.  She did have some shortness of breath earlier but this has resolved.  No chest pain or chest discomfort.  No head injury.  No headache.  Not on blood thinners.  No history of stroke or TIA.  No weakness.  No speech or vision changes.  ROS: See HPI Constitutional: no fever  Eyes: no drainage  ENT: no runny nose   Cardiovascular:  no chest pain  Resp: no SOB  GI: no vomiting GU: no dysuria Integumentary: no rash  Allergy: no hives  Musculoskeletal: no leg swelling  Neurological: no slurred speech ROS otherwise negative  PAST MEDICAL HISTORY/PAST SURGICAL HISTORY:  Past Medical History:  Diagnosis Date  . Arthritis   . GERD (gastroesophageal reflux disease)   . Headache(784.0)   . Hypercholesterolemia   . Hypertension   . Kidney stone 24-Nov-2009  passed on own  . Right shoulder pain    at night    MEDICATIONS:  Prior to Admission medications   Medication Sig Start Date End Date Taking? Authorizing Provider  amLODipine (NORVASC) 2.5 MG tablet Take 2.5 mg by mouth daily.    [provider]  Cholecalciferol (VITAMIN D3) 1.25 MG (50000 UT) TABS Take by mouth daily.    [provider]  esomeprazole (NEXIUM) 40 MG capsule Take 40 mg by mouth daily at 12 noon.    [provider]  metoprolol succinate (TOPROL-XL) 25 MG 24 hr tablet TAKE 1 TABLET BY MOUTH EVERY  DAY 10/01/19   Toniann Fail, NP  nitroGLYCERIN (NITROSTAT) 0.4 MG SL tablet Place 1 tablet (0.4 mg total) under the tongue every 5 (five) minutes as needed for chest pain. 03/15/19 06/13/19  Toniann Fail, NP  rosuvastatin (CRESTOR) 20 MG tablet Take 1 tablet by mouth daily. 04/07/19   [provider]    ALLERGIES:  Allergies  Allergen Reactions  . Sulfa Antibiotics Other (See Comments)    Eye turned yellow    SOCIAL HISTORY:  Social History   Tobacco Use  . Smoking status: Never Smoker  . Smokeless tobacco: Never Used  Substance Use Topics  . Alcohol use: No    FAMILY HISTORY: Family History  Problem Relation Age of Onset  . Heart attack Mother   . Heart attack Sister   . Heart attack Brother     EXAM: BP (!) 196/117 (BP Location: Left Arm)   Pulse 87   Temp 98.2 F (36.8 C) (Oral)   Resp 20   Ht 5\' 3"  (1.6 m)   Wt 62.6 kg   SpO2 100%   BMI 24.45 kg/m  CONSTITUTIONAL: Alert and oriented and responds appropriately to questions. Well-appearing; well-nourished HEAD: Normocephalic EYES: Conjunctivae clear, pupils appear equal, EOM appear intact ENT: normal nose; moist mucous membranes NECK: Supple, normal ROM CARD: RRR; S1 and S2 appreciated; no murmurs, no clicks, no rubs, no gallops RESP:  Normal chest excursion without splinting or tachypnea; breath sounds clear and equal bilaterally; no wheezes, no rhonchi, no rales, no hypoxia or respiratory distress, speaking full sentences ABD/GI: Normal bowel sounds; non-distended; soft, non-tender, no rebound, no guarding, no peritoneal signs, no hepatosplenomegaly BACK:  The back appears normal EXT: Normal ROM in all joints; no deformity noted, no edema; no cyanosis SKIN: Normal color for age and race; warm; no rash on exposed skin NEURO: Moves all extremities equally, no drift, sensation to light touch intact diffusely, cranial nerves II through XII intact, normal speech, normal visual fields, normal  finger-to-nose and heel-to-shin testing, NIH stroke scale 0 PSYCH: The patient's mood and manner are appropriate.   MEDICAL DECISION MAKING: Patient here with possible TIA.  She is extremely hypertensive here.  Will give IV hydralazine.  Will obtain labs, urine, head CT.  She also complains of some fluttering in her chest and some shortness of breath which has resolved.  No chest pain.  EKG shows sinus rhythm without arrhythmia, interval abnormality, ischemia.  Patient will likely need admission.  ED PROGRESS: Patient's work-up has been unremarkable.  Head CT negative for acute abnormality.  Blood pressure has improved to 160s/90s after hydralazine.  Discussed with Dr. Amada Jupiter with neurology who agrees with medical admission and would like patient transferred to [facility] once inpatient bed available.  Will discuss with hospitalist.   5:26 AM Discussed patient's case with hospitalist, Dr. Loney Loh.  I have recommended admission and patient (and family if present) agree with this plan. Admitting physician will place admission orders.   I reviewed all nursing notes, vitals, pertinent previous records and reviewed/interpreted all EKGs, lab and urine results, imaging (as available).    EKG Interpretation  Date/Time:  Sunday August 03 2020 04:05:10 EST Ventricular Rate:  92 PR Interval:    QRS Duration: 92 QT Interval:  376 QTC Calculation: 466 R Axis:   23 Text Interpretation: Sinus rhythm Biatrial enlargement Confirmed by Rochele Raring [phone]) on 08/03/2020 4:27:11 AM         Emily [name] was evaluated in Emergency Department on 08/03/2020 for the symptoms described in the history of present illness. She was evaluated in the context of the global COVID-19 pandemic, which necessitated consideration that the patient might be at risk for infection with the SARS-CoV-2 virus that causes COVID-19. Institutional protocols and algorithms that pertain to the evaluation of patients at risk  for COVID-19 are in a state of rapid change based on information released by regulatory bodies including the CDC and federal and state organizations. These policies and algorithms were followed during the patient's care in the ED.      Emily [name], Layla Maw, DO 08/03/20 [phone]

## 2020-08-03 NOTE — ED Notes (Signed)
Family in room, in to introduce self, no questions from pt or family at this time.

## 2020-08-18 DIAGNOSIS — G459 Transient cerebral ischemic attack, unspecified: Secondary | ICD-10-CM | POA: Diagnosis not present

## 2020-09-03 DIAGNOSIS — R079 Chest pain, unspecified: Secondary | ICD-10-CM | POA: Diagnosis not present

## 2020-09-04 DIAGNOSIS — R079 Chest pain, unspecified: Secondary | ICD-10-CM | POA: Diagnosis not present

## 2020-09-04 DIAGNOSIS — Z0389 Encounter for observation for other suspected diseases and conditions ruled out: Secondary | ICD-10-CM | POA: Diagnosis not present

## 2020-10-13 ENCOUNTER — Inpatient Hospital Stay: Payer: Self-pay | Admitting: Neurology

## 2020-11-12 DIAGNOSIS — R7301 Impaired fasting glucose: Secondary | ICD-10-CM | POA: Diagnosis not present

## 2020-11-12 DIAGNOSIS — E782 Mixed hyperlipidemia: Secondary | ICD-10-CM | POA: Diagnosis not present

## 2020-11-12 DIAGNOSIS — Z0000 Encounter for general adult medical examination without abnormal findings: Secondary | ICD-10-CM | POA: Diagnosis not present

## 2020-11-19 DIAGNOSIS — N182 Chronic kidney disease, stage 2 (mild): Secondary | ICD-10-CM | POA: Diagnosis not present

## 2020-11-19 DIAGNOSIS — K219 Gastro-esophageal reflux disease without esophagitis: Secondary | ICD-10-CM | POA: Diagnosis not present

## 2020-11-19 DIAGNOSIS — R7301 Impaired fasting glucose: Secondary | ICD-10-CM | POA: Diagnosis not present

## 2020-11-19 DIAGNOSIS — E559 Vitamin D deficiency, unspecified: Secondary | ICD-10-CM | POA: Diagnosis not present

## 2020-11-19 DIAGNOSIS — Z23 Encounter for immunization: Secondary | ICD-10-CM | POA: Diagnosis not present

## 2020-11-19 DIAGNOSIS — Z0000 Encounter for general adult medical examination without abnormal findings: Secondary | ICD-10-CM | POA: Diagnosis not present

## 2020-11-19 DIAGNOSIS — I10 Essential (primary) hypertension: Secondary | ICD-10-CM | POA: Diagnosis not present

## 2020-11-19 DIAGNOSIS — E782 Mixed hyperlipidemia: Secondary | ICD-10-CM | POA: Diagnosis not present

## 2020-12-16 ENCOUNTER — Emergency Department (HOSPITAL_COMMUNITY)
Admission: EM | Admit: 2020-12-16 | Discharge: 2020-12-17 | Disposition: A | Discharge status: Home / Self Care | Payer: Medicare PPO | Attending: Student | Admitting: Student

## 2020-12-16 ENCOUNTER — Encounter (HOSPITAL_COMMUNITY): Payer: Self-pay

## 2020-12-16 ENCOUNTER — Other Ambulatory Visit: Payer: Self-pay

## 2020-12-16 ENCOUNTER — Emergency Department (HOSPITAL_COMMUNITY): Payer: Medicare PPO

## 2020-12-16 DIAGNOSIS — M79604 Pain in right leg: Secondary | ICD-10-CM | POA: Diagnosis not present

## 2020-12-16 DIAGNOSIS — R0789 Other chest pain: Secondary | ICD-10-CM | POA: Diagnosis not present

## 2020-12-16 DIAGNOSIS — R079 Chest pain, unspecified: Secondary | ICD-10-CM | POA: Diagnosis not present

## 2020-12-16 DIAGNOSIS — R52 Pain, unspecified: Secondary | ICD-10-CM

## 2020-12-16 DIAGNOSIS — Z5321 Procedure and treatment not carried out due to patient leaving prior to being seen by health care provider: Secondary | ICD-10-CM | POA: Insufficient documentation

## 2020-12-16 DIAGNOSIS — M79605 Pain in left leg: Secondary | ICD-10-CM | POA: Diagnosis not present

## 2020-12-16 DIAGNOSIS — Y92410 Unspecified street and highway as the place of occurrence of the external cause: Secondary | ICD-10-CM | POA: Insufficient documentation
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
## 2020-12-17 NOTE — ED Notes (Signed)
PT decided to refuse care due to husband being a PT and awaiting surgery.

## 2020-12-25 DIAGNOSIS — R450 Nervousness: Secondary | ICD-10-CM | POA: Diagnosis not present

## 2020-12-25 DIAGNOSIS — F419 Anxiety disorder, unspecified: Secondary | ICD-10-CM | POA: Diagnosis not present

## 2020-12-27 DIAGNOSIS — Z20822 Contact with and (suspected) exposure to covid-19: Secondary | ICD-10-CM | POA: Diagnosis not present

## 2020-12-27 DIAGNOSIS — Z03818 Encounter for observation for suspected exposure to other biological agents ruled out: Secondary | ICD-10-CM | POA: Diagnosis not present

## 2021-01-12 DIAGNOSIS — G459 Transient cerebral ischemic attack, unspecified: Secondary | ICD-10-CM | POA: Diagnosis not present

## 2021-01-12 DIAGNOSIS — I10 Essential (primary) hypertension: Secondary | ICD-10-CM | POA: Diagnosis not present

## 2021-01-12 LAB — ECHOCARDIOGRAM COMPLETE
Area-P 1/2: 3.03 cm2
Height: 63 in
S' Lateral: 2.5 cm
Weight: 2208 oz

## 2021-01-26 ENCOUNTER — Encounter (HOSPITAL_BASED_OUTPATIENT_CLINIC_OR_DEPARTMENT_OTHER): Payer: Self-pay | Admitting: Obstetrics and Gynecology

## 2021-01-26 ENCOUNTER — Other Ambulatory Visit: Payer: Self-pay

## 2021-01-26 ENCOUNTER — Emergency Department (HOSPITAL_BASED_OUTPATIENT_CLINIC_OR_DEPARTMENT_OTHER)
Admission: EM | Admit: 2021-01-26 | Discharge: 2021-01-26 | Disposition: A | Discharge status: Home / Self Care | Payer: Medicare PPO | Attending: Emergency Medicine | Admitting: Emergency Medicine

## 2021-01-26 DIAGNOSIS — E876 Hypokalemia: Secondary | ICD-10-CM | POA: Insufficient documentation

## 2021-01-26 DIAGNOSIS — Z96642 Presence of left artificial hip joint: Secondary | ICD-10-CM | POA: Insufficient documentation

## 2021-01-26 DIAGNOSIS — Z7982 Long term (current) use of aspirin: Secondary | ICD-10-CM | POA: Insufficient documentation

## 2021-01-26 DIAGNOSIS — R42 Dizziness and giddiness: Secondary | ICD-10-CM | POA: Diagnosis not present

## 2021-01-26 DIAGNOSIS — Z79899 Other long term (current) drug therapy: Secondary | ICD-10-CM | POA: Diagnosis not present

## 2021-01-26 DIAGNOSIS — I10 Essential (primary) hypertension: Secondary | ICD-10-CM | POA: Insufficient documentation

## 2021-01-26 LAB — CBC
HCT: 44.5 % (ref 36.0–46.0)
Hemoglobin: 14.9 g/dL (ref 12.0–15.0)
MCH: 30.3 pg (ref 26.0–34.0)
MCHC: 33.5 g/dL (ref 30.0–36.0)
MCV: 90.6 fL (ref 80.0–100.0)
Platelets: 291 10*3/uL (ref 150–400)
RBC: 4.91 MIL/uL (ref 3.87–5.11)
RDW: 12.9 % (ref 11.5–15.5)
WBC: 7.3 10*3/uL (ref 4.0–10.5)
nRBC: 0 % (ref 0.0–0.2)

## 2021-01-26 LAB — BASIC METABOLIC PANEL
Anion gap: 11 (ref 5–15)
BUN: 10 mg/dL (ref 8–23)
CO2: 24 mmol/L (ref 22–32)
Calcium: 10 mg/dL (ref 8.9–10.3)
Chloride: 106 mmol/L (ref 98–111)
Creatinine, Ser: 0.72 mg/dL (ref 0.44–1.00)
GFR, Estimated: >60 mL/min (ref >60)
Glucose, Bld: 109 mg/dL — ABNORMAL HIGH (ref 70–99)
Potassium: 3.3 mmol/L — ABNORMAL LOW (ref 3.5–5.1)
Sodium: 141 mmol/L (ref 135–145)

## 2021-01-26 LAB — URINALYSIS, ROUTINE W REFLEX MICROSCOPIC
Bilirubin Urine: NEGATIVE
Glucose, UA: NEGATIVE mg/dL
Hgb urine dipstick: NEGATIVE
Ketones, ur: NEGATIVE mg/dL
Leukocytes,Ua: NEGATIVE
Nitrite: NEGATIVE
Protein, ur: NEGATIVE mg/dL
Specific Gravity, Urine: <1.005 — ABNORMAL LOW (ref 1.005–1.030)
pH: 7.5 (ref 5.0–8.0)

## 2021-01-26 MED ORDER — POTASSIUM CHLORIDE ER 10 MEQ PO TBCR: 10.0000 meq | EXTENDED_RELEASE_TABLET | Freq: Two times a day (BID) | ORAL | 0 refills | Status: AC

## 2021-01-26 NOTE — ED Provider Notes (Signed)
[facility] EMERGENCY DEPT Provider Note   CSN: [id number] Arrival date & time: 01/26/21  1231     History Chief Complaint  Patient presents with  . Dizziness  . Nausea    Emily [name] is a 75 y.o. female.  HPI Patient presents with dizziness.  States she had episode today when she had just taken care of her husband.  Patient states her husband just got out of rehab and she is taking care of him.  States she had been using a belt to help lift him then went and felt more lightheaded.  Felt just a little unsteady.  No chest pain.  No fevers.  Still feeling better now.  No numbness weakness.  No difficulty walking was not room spinning.  Just felt a little off.  No ringing in ears.  Has not really had episodes like this before.  Patient states her family is worried that she has not taken care of her self.      Past Medical History:  Diagnosis Date  . Arthritis   . GERD (gastroesophageal reflux disease)   . Headache(784.0)   . Hypercholesterolemia   . Hypertension   . Kidney stone 11/28/2009  passed on own  . Right shoulder pain    at night    Patient Active Problem List   Diagnosis Date Noted  . TIA (transient ischemic attack) 08/03/2020  . Hypokalemia 08/03/2020  . Essential hypertension 08/03/2020  . Hyperlipidemia 08/03/2020  . Chest pain 08/03/2020  . Postop Hyponatremia 11/18/2011  . Postop Acute blood loss anemia 11/18/2011  . Osteoarthritis of hip 11/15/2011    Past Surgical History:  Procedure Laterality Date  . colonscopy  2002  . TOTAL HIP ARTHROPLASTY  11/15/2011   Procedure: TOTAL HIP ARTHROPLASTY;  Surgeon: Loanne Drilling, MD;  Location: [facility];  Service: Orthopedics;  Laterality: Left;     OB History   No obstetric history on file.     Family History  Problem Relation Age of Onset  . Heart attack Mother   . Heart attack Sister   . Heart attack Brother     Social History   Tobacco Use  . Smoking status: Never Smoker  .  Smokeless tobacco: Never Used  Vaping Use  . Vaping Use: Never used  Substance Use Topics  . Alcohol use: No  . Drug use: No    Home Medications Prior to Admission medications   Medication Sig Start Date End Date Taking? Authorizing Provider  potassium chloride (KLOR-CON) 10 MEQ tablet Take 1 tablet (10 mEq total) by mouth 2 (two) times daily. 01/26/21  Yes Benjiman Core, MD  amLODipine (NORVASC) 5 MG tablet Take 5 mg by mouth daily. 07/18/20   [provider]  aspirin EC 81 MG EC tablet Take 1 tablet (81 mg total) by mouth daily. Swallow whole. 08/04/20   Dorcas Carrow, MD  Cholecalciferol (VITAMIN D3) 1.25 MG (50000 UT) TABS Take by mouth daily.    [provider]  esomeprazole (NEXIUM) 40 MG capsule Take 40 mg by mouth daily at 12 noon.    [provider]  metoprolol succinate (TOPROL-XL) 25 MG 24 hr tablet TAKE 1 TABLET BY MOUTH EVERY DAY Patient taking differently: Take 25 mg by mouth daily.  10/01/19   Toniann Fail, NP  rosuvastatin (CRESTOR) 20 MG tablet Take 1 tablet by mouth daily. 04/07/19   [provider]    Allergies    Sulfa antibiotics  Review of  Systems   Review of Systems  Physical Exam Updated Vital Signs BP 131/70 (BP Location: Right Arm)   Pulse 62   Temp 98.3 F (36.8 C) (Oral)   Resp 17   Ht 5\' 3"  (1.6 m)   Wt 58.5 kg   SpO2 100%   BMI 22.85 kg/m   Physical Exam  ED Results / Procedures / Treatments   Labs (all labs ordered are listed, but only abnormal results are displayed) Labs Reviewed  BASIC METABOLIC PANEL - Abnormal; Notable for the following components:      Result Value   Potassium 3.3 (*)    Glucose, Bld 109 (*)    All other components within normal limits  URINALYSIS, ROUTINE W REFLEX MICROSCOPIC - Abnormal; Notable for the following components:   Color, Urine COLORLESS (*)    Specific Gravity, Urine <1.005 (*)    All other components within normal limits  CBC     EKG None  Radiology No results found.  Procedures Procedures   Medications Ordered in ED Medications - No data to display  ED Course  I have reviewed the triage vital signs and the nursing notes.  Pertinent labs & imaging results that were available during my care of the patient were reviewed by me and considered in my medical decision making (see chart for details).    MDM Rules/Calculators/A&P                          Patient presents after feeling slightly dizzy.  Did not feel the room spinning.  Just felt a little "off.".  Feeling better now.  Benign exam.  Mild hypokalemia.  Otherwise work-up reassuring.  States she has been taking care of her husband and not really herself.  Central cause of unsteadiness such as stroke felt less likely.  Do not think we need MRI at this time.  Discharge home. Final Clinical Impression(s) / ED Diagnoses Final diagnoses:  Dizziness  Hypokalemia    Rx / DC Orders ED Discharge Orders         Ordered    potassium chloride (KLOR-CON) 10 MEQ tablet  2 times daily        01/26/21 1451           01/28/21, MD 01/27/21 [phone]

## 2021-01-26 NOTE — ED Triage Notes (Signed)
Patient reports to the ER for dizziness and nausea. Patient denies emesis, chest pain or SOB. Patient denies LOC or injury. Patient reports it started at 10:30 this morning.

## 2021-01-26 NOTE — ED Notes (Signed)
Ambulated to BR without difficulty

## 2021-01-26 NOTE — ED Notes (Signed)
Pt discharged home after verbalizing understanding of discharge instructions; nad noted. 

## 2021-02-05 DIAGNOSIS — I10 Essential (primary) hypertension: Secondary | ICD-10-CM | POA: Diagnosis not present

## 2021-02-05 DIAGNOSIS — R42 Dizziness and giddiness: Secondary | ICD-10-CM | POA: Diagnosis not present

## 2021-02-05 DIAGNOSIS — E876 Hypokalemia: Secondary | ICD-10-CM | POA: Diagnosis not present

## 2021-03-19 DIAGNOSIS — I10 Essential (primary) hypertension: Secondary | ICD-10-CM | POA: Diagnosis not present

## 2021-03-19 DIAGNOSIS — N182 Chronic kidney disease, stage 2 (mild): Secondary | ICD-10-CM | POA: Diagnosis not present

## 2021-03-19 DIAGNOSIS — E782 Mixed hyperlipidemia: Secondary | ICD-10-CM | POA: Diagnosis not present

## 2021-03-19 DIAGNOSIS — R7301 Impaired fasting glucose: Secondary | ICD-10-CM | POA: Diagnosis not present

## 2021-03-19 DIAGNOSIS — R42 Dizziness and giddiness: Secondary | ICD-10-CM | POA: Diagnosis not present

## 2021-06-10 DIAGNOSIS — E7801 Familial hypercholesterolemia: Secondary | ICD-10-CM | POA: Diagnosis not present

## 2021-06-18 DIAGNOSIS — R7301 Impaired fasting glucose: Secondary | ICD-10-CM | POA: Diagnosis not present

## 2021-06-18 DIAGNOSIS — I10 Essential (primary) hypertension: Secondary | ICD-10-CM | POA: Diagnosis not present

## 2021-06-18 DIAGNOSIS — N182 Chronic kidney disease, stage 2 (mild): Secondary | ICD-10-CM | POA: Diagnosis not present

## 2021-06-18 DIAGNOSIS — Z23 Encounter for immunization: Secondary | ICD-10-CM | POA: Diagnosis not present

## 2021-06-18 DIAGNOSIS — E782 Mixed hyperlipidemia: Secondary | ICD-10-CM | POA: Diagnosis not present

## 2021-08-14 DIAGNOSIS — Z1231 Encounter for screening mammogram for malignant neoplasm of breast: Secondary | ICD-10-CM | POA: Diagnosis not present

## 2021-10-08 DIAGNOSIS — R7301 Impaired fasting glucose: Secondary | ICD-10-CM | POA: Diagnosis not present

## 2021-10-08 DIAGNOSIS — I10 Essential (primary) hypertension: Secondary | ICD-10-CM | POA: Diagnosis not present

## 2021-11-03 DIAGNOSIS — M25511 Pain in right shoulder: Secondary | ICD-10-CM | POA: Diagnosis not present

## 2021-11-18 ENCOUNTER — Encounter (HOSPITAL_BASED_OUTPATIENT_CLINIC_OR_DEPARTMENT_OTHER): Payer: Self-pay | Admitting: *Deleted

## 2021-11-18 ENCOUNTER — Emergency Department (HOSPITAL_BASED_OUTPATIENT_CLINIC_OR_DEPARTMENT_OTHER)
Admission: EM | Admit: 2021-11-18 | Discharge: 2021-11-18 | Disposition: A | Discharge status: Home / Self Care | Payer: Medicare PPO | Attending: Emergency Medicine | Admitting: Emergency Medicine

## 2021-11-18 ENCOUNTER — Telehealth (HOSPITAL_BASED_OUTPATIENT_CLINIC_OR_DEPARTMENT_OTHER): Payer: Self-pay | Admitting: Emergency Medicine

## 2021-11-18 ENCOUNTER — Other Ambulatory Visit: Payer: Self-pay

## 2021-11-18 DIAGNOSIS — Z7982 Long term (current) use of aspirin: Secondary | ICD-10-CM | POA: Insufficient documentation

## 2021-11-18 DIAGNOSIS — I10 Essential (primary) hypertension: Secondary | ICD-10-CM

## 2021-11-18 DIAGNOSIS — Z79899 Other long term (current) drug therapy: Secondary | ICD-10-CM | POA: Insufficient documentation

## 2021-11-18 DIAGNOSIS — R030 Elevated blood-pressure reading, without diagnosis of hypertension: Secondary | ICD-10-CM | POA: Diagnosis not present

## 2021-11-18 DIAGNOSIS — M25512 Pain in left shoulder: Secondary | ICD-10-CM | POA: Diagnosis not present

## 2021-11-18 DIAGNOSIS — M25511 Pain in right shoulder: Secondary | ICD-10-CM | POA: Insufficient documentation

## 2021-11-18 MED ORDER — HYDROCODONE-ACETAMINOPHEN 5-325 MG PO TABS: 1.0000 | ORAL_TABLET | ORAL | 0 refills | Status: DC | PRN

## 2021-11-18 MED ORDER — OXYCODONE-ACETAMINOPHEN 5-325 MG PO TABS: 1.0000 | ORAL_TABLET | Freq: Four times a day (QID) | ORAL | 0 refills | Status: AC | PRN

## 2021-11-18 MED ORDER — PREDNISONE 50 MG PO TABS
60.0000 mg | ORAL_TABLET | Freq: Once | ORAL | Status: AC
  Administered 2021-11-18: 60 mg via ORAL
  Filled 2021-11-18: qty 1

## 2021-11-18 MED ORDER — PREDNISONE 50 MG PO TABS: 50.0000 mg | ORAL_TABLET | Freq: Every day | ORAL | 0 refills | Status: AC

## 2021-11-18 NOTE — ED Provider Notes (Signed)
?[facility] EMER[facility]ENCY DEPT ?Provider Note ? ? ?CSN: [id number] ?Arrival date & time: 11/18/21  9629 ? ?  ? ?History ? ?Chief Complaint  ?Patient presents with  ? Shoulder Pain  ? ? ?Emily [name] is a 76 y.o. female. ? ?The history is provided by the patient.  ?Shoulder Pain ?She has history of hypertension, hyperlipidemia, osteoarthritis and comes in because of bilateral shoulder pain.  She started having pain in her right shoulder about 1 month ago and saw her primary care provider who got x-rays and has referred her to orthopedics.  She has an appointment in about 2 weeks.  She was also started on meloxicam, which has not helped.  Since then, pain has spread to involve her left shoulder as well.  She denies any numbness or tingling but has inability to raise her arms.  She denies any trauma.  She denies any other joint pain but does admit to some generalized fatigue.  She denies fever, chills, sweats.  She states that pain wakes her up during the night and she has difficulty getting comfortable. ?  ?Home Medications ?Prior to Admission medications   ?Medication Sig Start Date End Date Taking? Authorizing Provider  ?HYDROcodone-acetaminophen (NORCO) 5-325 MG tablet Take 1 tablet by mouth every 4 (four) hours as needed for moderate pain. 11/18/21  Yes Dione Booze, MD  ?predniSONE (DELTASONE) 50 MG tablet Take 1 tablet (50 mg total) by mouth daily. 11/18/21  Yes Dione Booze, MD  ?amLODipine (NORVASC) 5 MG tablet Take 5 mg by mouth daily. 07/18/20   [provider]  ?aspirin EC 81 MG EC tablet Take 1 tablet (81 mg total) by mouth daily. Swallow whole. 08/04/20   Dorcas Carrow, MD  ?Cholecalciferol (VITAMIN D3) 1.25 MG (50000 UT) TABS Take by mouth daily.    [provider]  ?esomeprazole (NEXIUM) 40 MG capsule Take 40 mg by mouth daily at 12 noon.    [provider]  ?metoprolol succinate (TOPROL-XL) 25 MG 24 hr tablet TAKE 1 TABLET BY MOUTH EVERY DAY ?Patient taking  differently: Take 25 mg by mouth daily.  10/01/19   Toniann Fail, NP  ?potassium chloride (KLOR-CON) 10 MEQ tablet Take 1 tablet (10 mEq total) by mouth 2 (two) times daily. 01/26/21   Benjiman Core, MD  ?rosuvastatin (CRESTOR) 20 MG tablet Take 1 tablet by mouth daily. 04/07/19   [provider]  ?   ? ?Allergies    ?Sulfa antibiotics   ? ?Review of Systems   ?Review of Systems  ?All other systems reviewed and are negative. ? ?Physical Exam ?Updated Vital Signs ?BP (!) 172/99   Pulse 85   Temp 98.7 ?F (37.1 ?C) (Oral)   Resp 16   SpO2 99%  ?Physical Exam ?Vitals and nursing note reviewed.  ?76 year old female, resting comfortably and in no acute distress. Vital signs are significant for elevated blood pressure. Oxygen saturation is 99%, which is normal. ?Head is normocephalic and atraumatic. PERRLA, EOMI. Oropharynx is clear. ?Neck is nontender and supple without adenopathy or JVD. ?Back is nontender and there is no CVA tenderness. ?Lungs are clear without rales, wheezes, or rhonchi. ?Chest is nontender. ?Heart has regular rate and rhythm without murmur. ?Abdomen is soft, flat, nontender without masses or hepatosplenomegaly and peristalsis is normoactive. ?Extremities: There is no swelling or deformity noted.  There is tenderness in the anterior deltoid groove bilaterally, more prominent on the right than on the left.  Rotator cuff impingement signs are present,  more prominent on the right than on the left.  Range of motion is restricted to about 90 degrees of abduction bilaterally.  Distal neurovascular exam is intact with strong pulses, prompt capillary refill, normal strength of distal musculature.  Remainder of extremity exam is unremarkable. ?Skin is warm and dry without rash. ?Neurologic: Mental status is normal, cranial nerves are intact, moves all extremities equally. ? ?ED Results / Procedures / Treatments   ? ?Procedures ?Procedures  ? ? ?Medications Ordered in ED ?Medications   ?predniSONE (DELTASONE) tablet 60 mg (has no administration in time range)  ? ? ?ED Course/ Medical Decision Making/ A&P ?  ?                        ?Medical Decision Making ?Risk ?Prescription drug management. ? ? ?Bilateral shoulder pain with exam concerning for rotator cuff inflammation.  Consider polymyalgia rheumatica.  Patient brought a CD with her x-rays from her primary care provider, but there is no CD player available for me to review them, and no report is available on [facility] including in care everywhere.  We will give a therapeutic trial of prednisone for 5 days and also give prescription for hydrocodone-acetaminophen to use at night to help her sleep.  She is to keep her scheduled appointment with her orthopedic physician and is to continue taking her meloxicam. ? ? ? ? ? ? ? ?Final Clinical Impression(s) / ED Diagnoses ?Final diagnoses:  ?Bilateral shoulder pain, unspecified chronicity  ?Elevated blood pressure reading with diagnosis of hypertension  ? ? ?Rx / DC Orders ?ED Discharge Orders   ? ?      Ordered  ?  predniSONE (DELTASONE) 50 MG tablet  Daily       ? 11/18/21 0709  ?  HYDROcodone-acetaminophen (NORCO) 5-325 MG tablet  Every 4 hours PRN       ? 11/18/21 0709  ? ?  ?  ? ?  ? ? ?  ?Dione Booze, MD ?11/18/21 [phone] ? ?

## 2021-11-18 NOTE — Discharge Instructions (Addendum)
I suspect that your pain is related to rotator cuff inflammation, but you are being given a trial of prednisone to see if it might be something called polymyalgia rheumatica.  Take the prednisone once a day.  If you need something for pain at night to help you sleep, you may take the hydrocodone-acetaminophen.  Please continue taking your meloxicam.  Follow-up with your orthopedic physician as scheduled. ?

## 2021-11-18 NOTE — ED Notes (Signed)
She has phoned twice in an attempt to fill pain med. ([facility] is out). However, with [facility] were able to determine that they have the requisite medication available at the [facility], where Dr. Laverta Baltimore sends the prescription at this time. ?

## 2021-11-18 NOTE — ED Triage Notes (Signed)
Pt says that she has had right shoulder pain for over two weeks, denies specific injury. Seen by her doctor about 1.5 weeks ago.  Taking meloxicam for pain without relief.  ?

## 2021-11-18 NOTE — Telephone Encounter (Signed)
[facility] out of pain medication. Will send to alternate location.  ?

## 2021-11-24 DIAGNOSIS — N182 Chronic kidney disease, stage 2 (mild): Secondary | ICD-10-CM | POA: Diagnosis not present

## 2021-11-24 DIAGNOSIS — R5383 Other fatigue: Secondary | ICD-10-CM | POA: Diagnosis not present

## 2021-11-24 DIAGNOSIS — E782 Mixed hyperlipidemia: Secondary | ICD-10-CM | POA: Diagnosis not present

## 2021-11-24 DIAGNOSIS — R7301 Impaired fasting glucose: Secondary | ICD-10-CM | POA: Diagnosis not present

## 2021-11-24 DIAGNOSIS — I10 Essential (primary) hypertension: Secondary | ICD-10-CM | POA: Diagnosis not present

## 2021-12-01 DIAGNOSIS — M25512 Pain in left shoulder: Secondary | ICD-10-CM | POA: Diagnosis not present

## 2021-12-01 DIAGNOSIS — M25511 Pain in right shoulder: Secondary | ICD-10-CM | POA: Diagnosis not present

## 2021-12-03 DIAGNOSIS — Z0000 Encounter for general adult medical examination without abnormal findings: Secondary | ICD-10-CM | POA: Diagnosis not present

## 2021-12-03 DIAGNOSIS — R7303 Prediabetes: Secondary | ICD-10-CM | POA: Diagnosis not present

## 2021-12-03 DIAGNOSIS — E782 Mixed hyperlipidemia: Secondary | ICD-10-CM | POA: Diagnosis not present

## 2021-12-03 DIAGNOSIS — I10 Essential (primary) hypertension: Secondary | ICD-10-CM | POA: Diagnosis not present

## 2021-12-03 DIAGNOSIS — N182 Chronic kidney disease, stage 2 (mild): Secondary | ICD-10-CM | POA: Diagnosis not present

## 2021-12-17 DIAGNOSIS — M25511 Pain in right shoulder: Secondary | ICD-10-CM | POA: Diagnosis not present

## 2021-12-17 DIAGNOSIS — M25512 Pain in left shoulder: Secondary | ICD-10-CM | POA: Diagnosis not present

## 2021-12-24 DIAGNOSIS — M85851 Other specified disorders of bone density and structure, right thigh: Secondary | ICD-10-CM | POA: Diagnosis not present

## 2021-12-30 DIAGNOSIS — M353 Polymyalgia rheumatica: Secondary | ICD-10-CM | POA: Diagnosis not present

## 2021-12-30 DIAGNOSIS — M25511 Pain in right shoulder: Secondary | ICD-10-CM | POA: Diagnosis not present

## 2021-12-30 DIAGNOSIS — K219 Gastro-esophageal reflux disease without esophagitis: Secondary | ICD-10-CM | POA: Diagnosis not present

## 2021-12-30 DIAGNOSIS — R7303 Prediabetes: Secondary | ICD-10-CM | POA: Diagnosis not present

## 2021-12-30 DIAGNOSIS — M2560 Stiffness of unspecified joint, not elsewhere classified: Secondary | ICD-10-CM | POA: Diagnosis not present

## 2022-01-27 DIAGNOSIS — H35362 Drusen (degenerative) of macula, left eye: Secondary | ICD-10-CM | POA: Diagnosis not present

## 2022-01-27 DIAGNOSIS — H25813 Combined forms of age-related cataract, bilateral: Secondary | ICD-10-CM | POA: Diagnosis not present

## 2022-01-27 DIAGNOSIS — H524 Presbyopia: Secondary | ICD-10-CM | POA: Diagnosis not present

## 2022-01-28 DIAGNOSIS — R7303 Prediabetes: Secondary | ICD-10-CM | POA: Diagnosis not present

## 2022-01-28 DIAGNOSIS — M25511 Pain in right shoulder: Secondary | ICD-10-CM | POA: Diagnosis not present

## 2022-01-28 DIAGNOSIS — K219 Gastro-esophageal reflux disease without esophagitis: Secondary | ICD-10-CM | POA: Diagnosis not present

## 2022-01-28 DIAGNOSIS — M353 Polymyalgia rheumatica: Secondary | ICD-10-CM | POA: Diagnosis not present

## 2022-01-28 DIAGNOSIS — M2560 Stiffness of unspecified joint, not elsewhere classified: Secondary | ICD-10-CM | POA: Diagnosis not present

## 2022-01-28 DIAGNOSIS — M810 Age-related osteoporosis without current pathological fracture: Secondary | ICD-10-CM | POA: Diagnosis not present

## 2022-03-25 DIAGNOSIS — K219 Gastro-esophageal reflux disease without esophagitis: Secondary | ICD-10-CM | POA: Diagnosis not present

## 2022-03-25 DIAGNOSIS — M810 Age-related osteoporosis without current pathological fracture: Secondary | ICD-10-CM | POA: Diagnosis not present

## 2022-03-25 DIAGNOSIS — M353 Polymyalgia rheumatica: Secondary | ICD-10-CM | POA: Diagnosis not present

## 2022-03-25 DIAGNOSIS — M25511 Pain in right shoulder: Secondary | ICD-10-CM | POA: Diagnosis not present

## 2022-03-25 DIAGNOSIS — R7303 Prediabetes: Secondary | ICD-10-CM | POA: Diagnosis not present

## 2022-03-25 DIAGNOSIS — M2560 Stiffness of unspecified joint, not elsewhere classified: Secondary | ICD-10-CM | POA: Diagnosis not present

## 2022-04-29 DIAGNOSIS — R7303 Prediabetes: Secondary | ICD-10-CM | POA: Diagnosis not present

## 2022-04-29 DIAGNOSIS — M0609 Rheumatoid arthritis without rheumatoid factor, multiple sites: Secondary | ICD-10-CM | POA: Diagnosis not present

## 2022-04-29 DIAGNOSIS — M353 Polymyalgia rheumatica: Secondary | ICD-10-CM | POA: Diagnosis not present

## 2022-04-29 DIAGNOSIS — Z79899 Other long term (current) drug therapy: Secondary | ICD-10-CM | POA: Diagnosis not present

## 2022-04-29 DIAGNOSIS — M810 Age-related osteoporosis without current pathological fracture: Secondary | ICD-10-CM | POA: Diagnosis not present

## 2022-04-29 DIAGNOSIS — M25511 Pain in right shoulder: Secondary | ICD-10-CM | POA: Diagnosis not present

## 2022-04-29 DIAGNOSIS — K219 Gastro-esophageal reflux disease without esophagitis: Secondary | ICD-10-CM | POA: Diagnosis not present

## 2022-04-29 DIAGNOSIS — M2560 Stiffness of unspecified joint, not elsewhere classified: Secondary | ICD-10-CM | POA: Diagnosis not present

## 2022-05-26 DIAGNOSIS — M353 Polymyalgia rheumatica: Secondary | ICD-10-CM | POA: Diagnosis not present

## 2022-05-26 DIAGNOSIS — M2560 Stiffness of unspecified joint, not elsewhere classified: Secondary | ICD-10-CM | POA: Diagnosis not present

## 2022-05-26 DIAGNOSIS — M25511 Pain in right shoulder: Secondary | ICD-10-CM | POA: Diagnosis not present

## 2022-05-26 DIAGNOSIS — M0609 Rheumatoid arthritis without rheumatoid factor, multiple sites: Secondary | ICD-10-CM | POA: Diagnosis not present

## 2022-05-26 DIAGNOSIS — K219 Gastro-esophageal reflux disease without esophagitis: Secondary | ICD-10-CM | POA: Diagnosis not present

## 2022-05-26 DIAGNOSIS — R7303 Prediabetes: Secondary | ICD-10-CM | POA: Diagnosis not present

## 2022-05-26 DIAGNOSIS — M810 Age-related osteoporosis without current pathological fracture: Secondary | ICD-10-CM | POA: Diagnosis not present

## 2022-05-26 DIAGNOSIS — Z79899 Other long term (current) drug therapy: Secondary | ICD-10-CM | POA: Diagnosis not present

## 2022-05-31 DIAGNOSIS — R7303 Prediabetes: Secondary | ICD-10-CM | POA: Diagnosis not present

## 2022-05-31 DIAGNOSIS — E782 Mixed hyperlipidemia: Secondary | ICD-10-CM | POA: Diagnosis not present

## 2022-05-31 DIAGNOSIS — N182 Chronic kidney disease, stage 2 (mild): Secondary | ICD-10-CM | POA: Diagnosis not present

## 2022-05-31 DIAGNOSIS — I10 Essential (primary) hypertension: Secondary | ICD-10-CM | POA: Diagnosis not present

## 2022-06-10 DIAGNOSIS — M353 Polymyalgia rheumatica: Secondary | ICD-10-CM | POA: Diagnosis not present

## 2022-06-10 DIAGNOSIS — R7303 Prediabetes: Secondary | ICD-10-CM | POA: Diagnosis not present

## 2022-06-10 DIAGNOSIS — R7301 Impaired fasting glucose: Secondary | ICD-10-CM | POA: Diagnosis not present

## 2022-06-10 DIAGNOSIS — N182 Chronic kidney disease, stage 2 (mild): Secondary | ICD-10-CM | POA: Diagnosis not present

## 2022-06-10 DIAGNOSIS — R5383 Other fatigue: Secondary | ICD-10-CM | POA: Diagnosis not present

## 2022-06-10 DIAGNOSIS — M810 Age-related osteoporosis without current pathological fracture: Secondary | ICD-10-CM | POA: Diagnosis not present

## 2022-06-10 DIAGNOSIS — Z23 Encounter for immunization: Secondary | ICD-10-CM | POA: Diagnosis not present

## 2022-06-10 DIAGNOSIS — E782 Mixed hyperlipidemia: Secondary | ICD-10-CM | POA: Diagnosis not present

## 2022-06-10 DIAGNOSIS — I10 Essential (primary) hypertension: Secondary | ICD-10-CM | POA: Diagnosis not present

## 2022-07-02 DIAGNOSIS — K219 Gastro-esophageal reflux disease without esophagitis: Secondary | ICD-10-CM | POA: Diagnosis not present

## 2022-07-02 DIAGNOSIS — M0609 Rheumatoid arthritis without rheumatoid factor, multiple sites: Secondary | ICD-10-CM | POA: Diagnosis not present

## 2022-07-02 DIAGNOSIS — M810 Age-related osteoporosis without current pathological fracture: Secondary | ICD-10-CM | POA: Diagnosis not present

## 2022-07-02 DIAGNOSIS — M353 Polymyalgia rheumatica: Secondary | ICD-10-CM | POA: Diagnosis not present

## 2022-07-02 DIAGNOSIS — M25511 Pain in right shoulder: Secondary | ICD-10-CM | POA: Diagnosis not present

## 2022-07-02 DIAGNOSIS — M2560 Stiffness of unspecified joint, not elsewhere classified: Secondary | ICD-10-CM | POA: Diagnosis not present

## 2022-07-02 DIAGNOSIS — Z79899 Other long term (current) drug therapy: Secondary | ICD-10-CM | POA: Diagnosis not present

## 2022-08-17 DIAGNOSIS — M353 Polymyalgia rheumatica: Secondary | ICD-10-CM | POA: Diagnosis not present

## 2022-08-17 DIAGNOSIS — M2540 Effusion, unspecified joint: Secondary | ICD-10-CM | POA: Diagnosis not present

## 2022-08-17 DIAGNOSIS — M79641 Pain in right hand: Secondary | ICD-10-CM | POA: Diagnosis not present

## 2022-08-17 DIAGNOSIS — M79642 Pain in left hand: Secondary | ICD-10-CM | POA: Diagnosis not present

## 2022-08-17 DIAGNOSIS — M79671 Pain in right foot: Secondary | ICD-10-CM | POA: Diagnosis not present

## 2022-08-17 DIAGNOSIS — M0600 Rheumatoid arthritis without rheumatoid factor, unspecified site: Secondary | ICD-10-CM | POA: Diagnosis not present

## 2022-08-17 DIAGNOSIS — M79672 Pain in left foot: Secondary | ICD-10-CM | POA: Diagnosis not present

## 2022-08-17 DIAGNOSIS — M2560 Stiffness of unspecified joint, not elsewhere classified: Secondary | ICD-10-CM | POA: Diagnosis not present

## 2022-09-21 DIAGNOSIS — M79641 Pain in right hand: Secondary | ICD-10-CM | POA: Diagnosis not present

## 2022-09-21 DIAGNOSIS — Z6823 Body mass index (BMI) 23.0-23.9, adult: Secondary | ICD-10-CM | POA: Diagnosis not present

## 2022-09-21 DIAGNOSIS — M0600 Rheumatoid arthritis without rheumatoid factor, unspecified site: Secondary | ICD-10-CM | POA: Diagnosis not present

## 2022-09-21 DIAGNOSIS — M79642 Pain in left hand: Secondary | ICD-10-CM | POA: Diagnosis not present

## 2022-09-21 DIAGNOSIS — M2540 Effusion, unspecified joint: Secondary | ICD-10-CM | POA: Diagnosis not present

## 2022-09-21 DIAGNOSIS — M353 Polymyalgia rheumatica: Secondary | ICD-10-CM | POA: Diagnosis not present

## 2022-09-21 DIAGNOSIS — M2560 Stiffness of unspecified joint, not elsewhere classified: Secondary | ICD-10-CM | POA: Diagnosis not present

## 2022-09-21 DIAGNOSIS — M79672 Pain in left foot: Secondary | ICD-10-CM | POA: Diagnosis not present

## 2022-09-21 DIAGNOSIS — M79671 Pain in right foot: Secondary | ICD-10-CM | POA: Diagnosis not present

## 2022-10-14 DIAGNOSIS — R350 Frequency of micturition: Secondary | ICD-10-CM | POA: Diagnosis not present

## 2022-11-01 DIAGNOSIS — F419 Anxiety disorder, unspecified: Secondary | ICD-10-CM | POA: Diagnosis not present

## 2022-11-26 IMAGING — CT CT ANGIO NECK
2 of 7 series · 8 of 33 positions shown · IV contrast (omnipaque)
Comparison: Same day CT head.

CLINICAL DATA: Stroke/TIA.  Left-sided arm/leg numbness.



[Series 5: cta head neck · axial · 0.43mm/px · z∈[-226,-122]mm · 2 of 156 slices shown]
[im 52/156  soft-tissue]
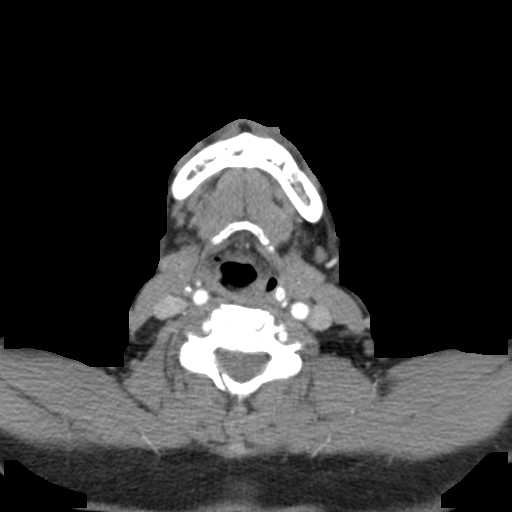
[im 104/156  soft-tissue]
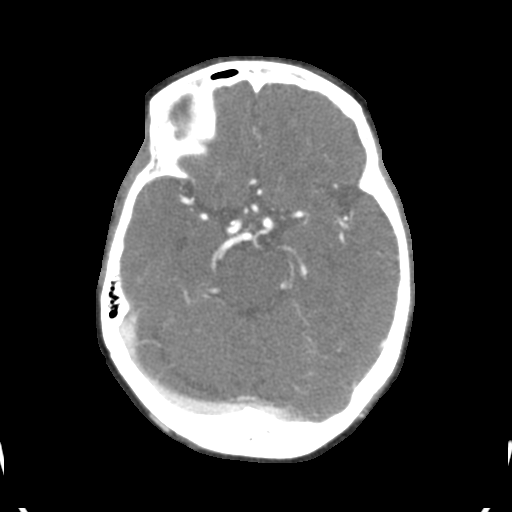

[Series 7: ax thin · axial · 0.39mm/px · z∈[-285,-66]mm · 6 of 307 slices shown]
[im 44/307  soft-tissue]
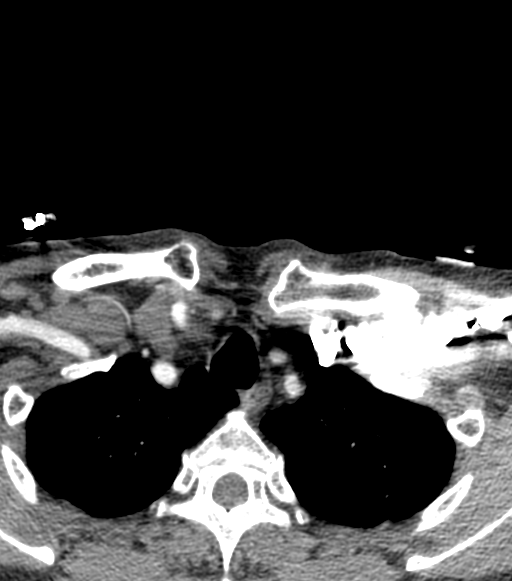
[im 88/307  bone]
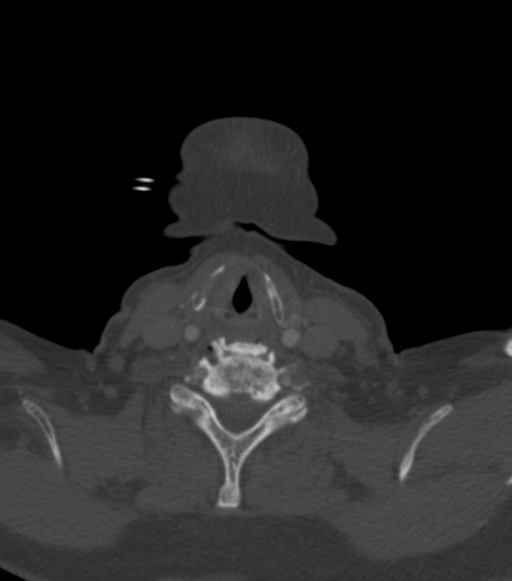
[im 132/307  soft-tissue]
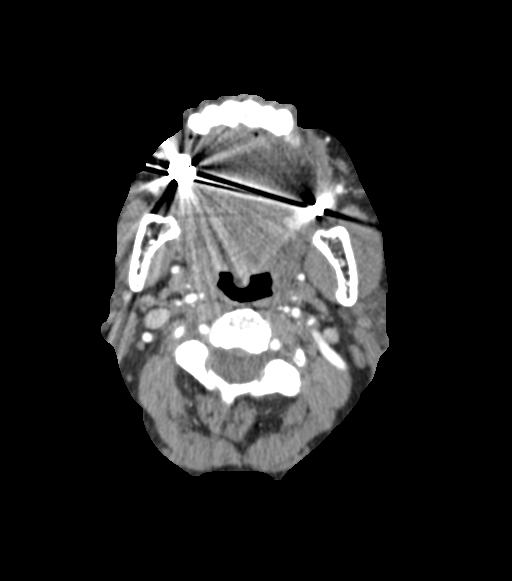
[im 175/307  bone]
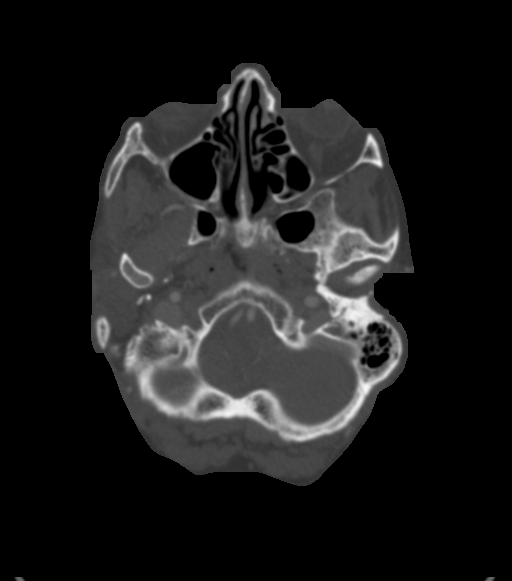
[im 219/307  soft-tissue]
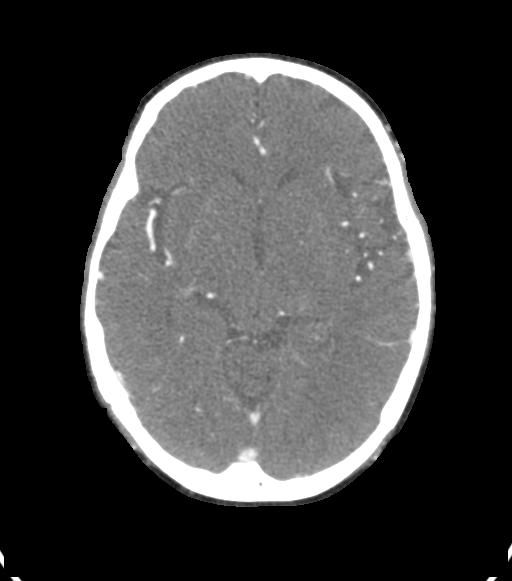
[im 263/307  bone]
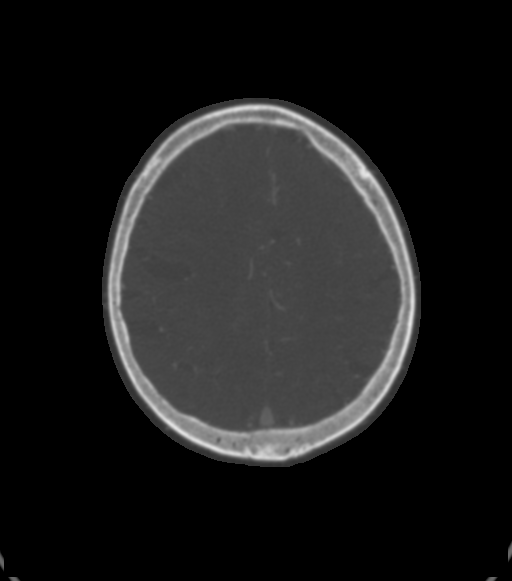

[8 of 33 positions shown; findings below may reference images not displayed]

FINDINGS: CTA NECK FINDINGS

Aortic arch: Great vessel origins are patent.

Right carotid system: No evidence of dissection, stenosis (50% or
greater) or occlusion.

Left carotid system: No evidence of dissection, stenosis (50% or
greater) or occlusion. Mild mixed calcific and noncalcific
atherosclerosis at the carotid bifurcation.

Vertebral arteries: Limited visualization proximally secondary to
streak and motion. Within this limitation, no evidence of
hemodynamically significant stenosis or occlusion.

Skeleton: No acute fracture. Severe degenerative disc disease at
C4-C5, C5-C6, and C6-C7.

Other neck: No mass or suspicious adenopathy.

Upper chest: No acute findings.

Review of the MIP images confirms the above findings

CTA HEAD FINDINGS

Anterior circulation: Bilateral petrous and cavernous carotid
arteries are patent. Cavernous carotid arteries are tortuous
bilaterally. There is moderate stenosis of the right paraclinoid ICA
secondary to calcific atherosclerosis. There is focal severe
stenosis of the distal left M1 MCA.

Posterior circulation: Moderate stenosis of the distal left
intradural vertebral artery. Basilar arteries patent. Left fetal
type PCA. Bilateral posterior cerebral arteries are patent without
evidence of hemodynamically significant stenosis. Bilateral A1 and
A2 ACAs are patent without evidence of hemodynamically significant
stenosis.

Venous sinuses: As permitted by contrast timing, patent.

Review of the MIP images confirms the above findings
IMPRESSION: 1. No evidence of large vessel occlusion.
2. Focal severe stenosis of the left distal M1 MCA.
3. Moderate stenosis of the right paraclinoid ICA and the distal
left intradural vertebral artery secondary to atherosclerosis.
4. No evidence of hemodynamically significant stenosis in the neck.

## 2022-11-26 IMAGING — CR DG CHEST 2V
2 series · 2 of 2 positions shown · non-contrast
Comparison: 11/08/2011

CLINICAL DATA: Palpitations and shortness of breath

EXAM:
CHEST - 2 VIEW

[w chest pa]
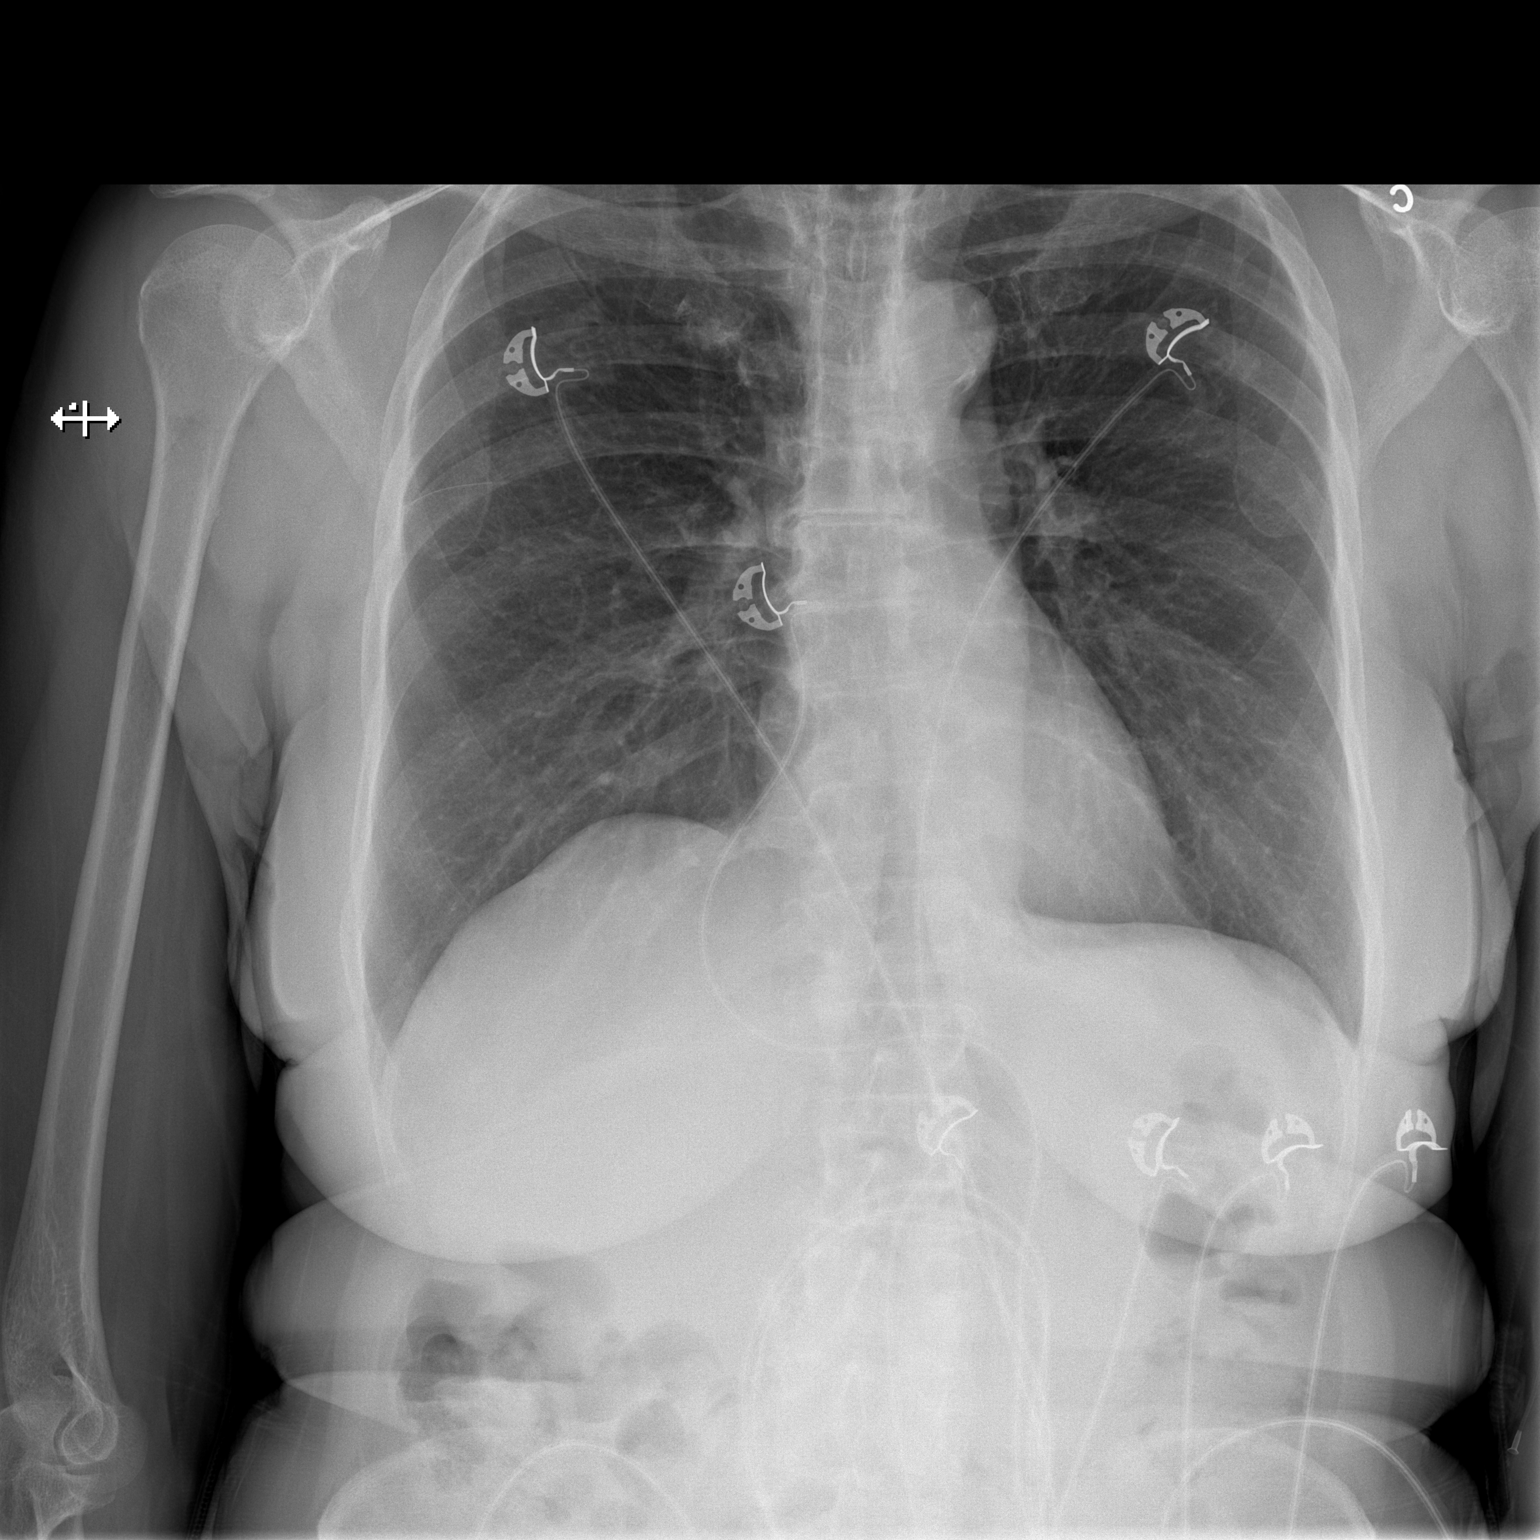

[w chest lat]
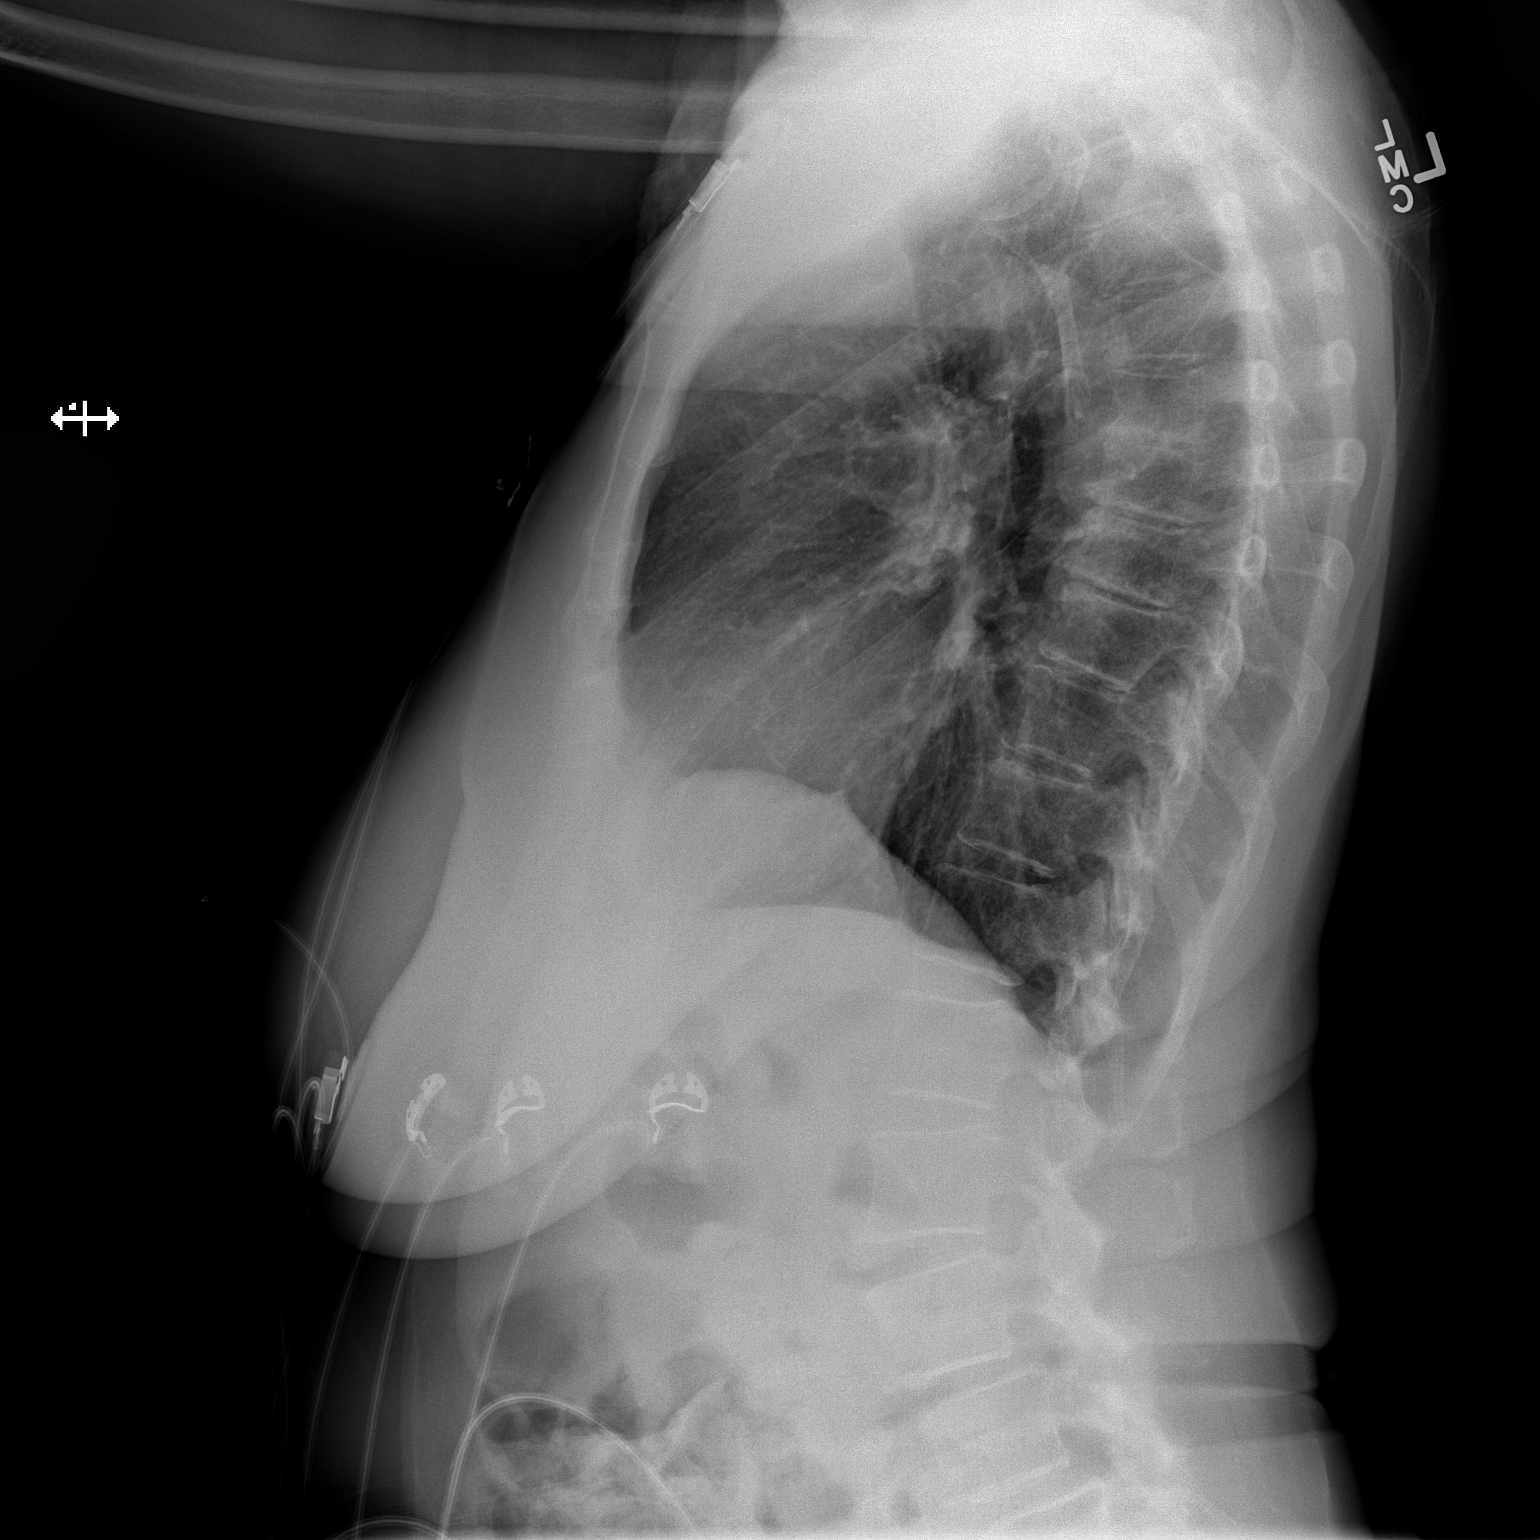

[2 of 2 positions shown; findings below may reference images not displayed]

FINDINGS: The heart size and mediastinal contours are within normal limits.
Both lungs are clear. The visualized skeletal structures are
unremarkable.
IMPRESSION: No active cardiopulmonary disease.

## 2022-12-06 DIAGNOSIS — F419 Anxiety disorder, unspecified: Secondary | ICD-10-CM | POA: Diagnosis not present

## 2022-12-22 DIAGNOSIS — M2540 Effusion, unspecified joint: Secondary | ICD-10-CM | POA: Diagnosis not present

## 2022-12-22 DIAGNOSIS — M353 Polymyalgia rheumatica: Secondary | ICD-10-CM | POA: Diagnosis not present

## 2022-12-22 DIAGNOSIS — M79671 Pain in right foot: Secondary | ICD-10-CM | POA: Diagnosis not present

## 2022-12-22 DIAGNOSIS — M2560 Stiffness of unspecified joint, not elsewhere classified: Secondary | ICD-10-CM | POA: Diagnosis not present

## 2022-12-22 DIAGNOSIS — M79642 Pain in left hand: Secondary | ICD-10-CM | POA: Diagnosis not present

## 2022-12-22 DIAGNOSIS — M79641 Pain in right hand: Secondary | ICD-10-CM | POA: Diagnosis not present

## 2022-12-22 DIAGNOSIS — M0600 Rheumatoid arthritis without rheumatoid factor, unspecified site: Secondary | ICD-10-CM | POA: Diagnosis not present

## 2022-12-22 DIAGNOSIS — M79672 Pain in left foot: Secondary | ICD-10-CM | POA: Diagnosis not present

## 2022-12-22 DIAGNOSIS — R634 Abnormal weight loss: Secondary | ICD-10-CM | POA: Diagnosis not present

## 2022-12-23 DIAGNOSIS — I10 Essential (primary) hypertension: Secondary | ICD-10-CM | POA: Diagnosis not present

## 2022-12-23 DIAGNOSIS — R7303 Prediabetes: Secondary | ICD-10-CM | POA: Diagnosis not present

## 2022-12-23 DIAGNOSIS — R634 Abnormal weight loss: Secondary | ICD-10-CM | POA: Diagnosis not present

## 2023-02-14 DIAGNOSIS — I10 Essential (primary) hypertension: Secondary | ICD-10-CM | POA: Diagnosis not present

## 2023-02-14 DIAGNOSIS — E782 Mixed hyperlipidemia: Secondary | ICD-10-CM | POA: Diagnosis not present

## 2023-02-14 DIAGNOSIS — R5383 Other fatigue: Secondary | ICD-10-CM | POA: Diagnosis not present

## 2023-02-14 DIAGNOSIS — R7303 Prediabetes: Secondary | ICD-10-CM | POA: Diagnosis not present

## 2023-02-21 DIAGNOSIS — F419 Anxiety disorder, unspecified: Secondary | ICD-10-CM | POA: Diagnosis not present

## 2023-02-21 DIAGNOSIS — I10 Essential (primary) hypertension: Secondary | ICD-10-CM | POA: Diagnosis not present

## 2023-02-21 DIAGNOSIS — R7303 Prediabetes: Secondary | ICD-10-CM | POA: Diagnosis not present

## 2023-02-21 DIAGNOSIS — Z0000 Encounter for general adult medical examination without abnormal findings: Secondary | ICD-10-CM | POA: Diagnosis not present

## 2023-02-21 DIAGNOSIS — E782 Mixed hyperlipidemia: Secondary | ICD-10-CM | POA: Diagnosis not present

## 2023-02-21 DIAGNOSIS — N182 Chronic kidney disease, stage 2 (mild): Secondary | ICD-10-CM | POA: Diagnosis not present

## 2023-02-21 DIAGNOSIS — M810 Age-related osteoporosis without current pathological fracture: Secondary | ICD-10-CM | POA: Diagnosis not present

## 2023-04-18 DIAGNOSIS — R002 Palpitations: Secondary | ICD-10-CM | POA: Diagnosis not present

## 2023-04-18 DIAGNOSIS — I10 Essential (primary) hypertension: Secondary | ICD-10-CM | POA: Diagnosis not present

## 2023-04-18 DIAGNOSIS — F419 Anxiety disorder, unspecified: Secondary | ICD-10-CM | POA: Diagnosis not present

## 2023-04-20 ENCOUNTER — Encounter (HOSPITAL_BASED_OUTPATIENT_CLINIC_OR_DEPARTMENT_OTHER): Payer: Self-pay | Admitting: Emergency Medicine

## 2023-04-20 ENCOUNTER — Emergency Department (HOSPITAL_BASED_OUTPATIENT_CLINIC_OR_DEPARTMENT_OTHER)
Admission: EM | Admit: 2023-04-20 | Discharge: 2023-04-20 | Disposition: A | Discharge status: Home / Self Care | Payer: Medicare PPO | Attending: Emergency Medicine | Admitting: Emergency Medicine

## 2023-04-20 ENCOUNTER — Other Ambulatory Visit: Payer: Self-pay

## 2023-04-20 DIAGNOSIS — Z7982 Long term (current) use of aspirin: Secondary | ICD-10-CM | POA: Diagnosis not present

## 2023-04-20 DIAGNOSIS — Z79899 Other long term (current) drug therapy: Secondary | ICD-10-CM | POA: Diagnosis not present

## 2023-04-20 DIAGNOSIS — I159 Secondary hypertension, unspecified: Secondary | ICD-10-CM | POA: Insufficient documentation

## 2023-04-20 DIAGNOSIS — I10 Essential (primary) hypertension: Secondary | ICD-10-CM | POA: Diagnosis not present

## 2023-04-20 LAB — BASIC METABOLIC PANEL
Anion gap: 11 (ref 5–15)
BUN: 7 mg/dL — ABNORMAL LOW (ref 8–23)
CO2: 25 mmol/L (ref 22–32)
Calcium: 10.3 mg/dL (ref 8.9–10.3)
Chloride: 106 mmol/L (ref 98–111)
Creatinine, Ser: 0.79 mg/dL (ref 0.44–1.00)
GFR, Estimated: >60 mL/min (ref >60)
Glucose, Bld: 102 mg/dL — ABNORMAL HIGH (ref 70–99)
Potassium: 3.4 mmol/L — ABNORMAL LOW (ref 3.5–5.1)
Sodium: 142 mmol/L (ref 135–145)

## 2023-04-20 LAB — CBC
HCT: 44.6 % (ref 36.0–46.0)
Hemoglobin: 15.3 g/dL — ABNORMAL HIGH (ref 12.0–15.0)
MCH: 31 pg (ref 26.0–34.0)
MCHC: 34.3 g/dL (ref 30.0–36.0)
MCV: 90.5 fL (ref 80.0–100.0)
Platelets: 288 10*3/uL (ref 150–400)
RBC: 4.93 MIL/uL (ref 3.87–5.11)
RDW: 13.2 % (ref 11.5–15.5)
WBC: 6.4 10*3/uL (ref 4.0–10.5)
nRBC: 0 % (ref 0.0–0.2)

## 2023-04-20 MED ORDER — HYDROCHLOROTHIAZIDE 25 MG PO TABS: 25.0000 mg | ORAL_TABLET | Freq: Every day | ORAL | 0 refills | Status: AC

## 2023-04-20 NOTE — ED Provider Notes (Signed)
Emily [name] EMERGENCY DEPARTMENT AT [facility] Provider Note   CSN: [id number] Arrival date & time: 04/20/23  1332     History  Chief Complaint  Patient presents with   Hypertension    Emily [name] is a 77 y.o. female.  77 year old female with a history of hypertension who presents to the emergency department with elevated blood pressures.  Patient reports she has been under quite a bit of stress because of the poor health of her husband.  Says that over the past few days her blood pressure has been in the 160s systolic.  Several days ago was started on metoprolol XL 25 by her primary doctor.  Also takes amlodipine 5 mg daily for her blood pressure.  Says that today she took her blood pressure and it was 180/100.  Says that she started feeling lightheaded because of her blood pressure and decided to come into the emergency department for evaluation.  No headache, vision changes, numbness or weakness of arms or legs, chest pain, or shortness of breath.       Home Medications Prior to Admission medications   Medication Sig Start Date End Date Taking? Authorizing Provider  hydrochlorothiazide (HYDRODIURIL) 25 MG tablet Take 1 tablet (25 mg total) by mouth daily. 04/20/23  Yes Rondel Baton, MD  amLODipine (NORVASC) 5 MG tablet Take 5 mg by mouth daily. 07/18/20   [provider]  aspirin EC 81 MG EC tablet Take 1 tablet (81 mg total) by mouth daily. Swallow whole. 08/04/20   Dorcas Carrow, MD  Cholecalciferol (VITAMIN D3) 1.25 MG (50000 UT) TABS Take by mouth daily.    [provider]  esomeprazole (NEXIUM) 40 MG capsule Take 40 mg by mouth daily at 12 noon.    [provider]  metoprolol succinate (TOPROL-XL) 25 MG 24 hr tablet TAKE 1 TABLET BY MOUTH EVERY DAY Patient taking differently: Take 25 mg by mouth daily.  10/01/19   Toniann Fail, NP  oxyCODONE-acetaminophen (PERCOCET/ROXICET) 5-325 MG tablet Take 1 tablet by mouth every 6  (six) hours as needed for severe pain. 11/18/21   Long, Arlyss Repress, MD  oxyCODONE-acetaminophen (PERCOCET/ROXICET) 5-325 MG tablet Take 1 tablet by mouth every 6 (six) hours as needed for severe pain. 11/18/21   Long, Arlyss Repress, MD  potassium chloride (KLOR-CON) 10 MEQ tablet Take 1 tablet (10 mEq total) by mouth 2 (two) times daily. 01/26/21   Benjiman Core, MD  predniSONE (DELTASONE) 50 MG tablet Take 1 tablet (50 mg total) by mouth daily. 11/18/21   Dione Booze, MD  rosuvastatin (CRESTOR) 20 MG tablet Take 1 tablet by mouth daily. 04/07/19   [provider]      Allergies    Sulfa antibiotics    Review of Systems   Review of Systems  Physical Exam Updated Vital Signs BP (!) 173/95   Pulse 61   Temp 97.6 F (36.4 C) (Oral)   Resp 19   Wt 58 kg   SpO2 100%   BMI 22.65 kg/m  Physical Exam Vitals and nursing note reviewed.  Constitutional:      General: She is not in acute distress.    Appearance: She is well-developed.  HENT:     Head: Normocephalic and atraumatic.     Right Ear: External ear normal.     Left Ear: External ear normal.     Nose: Nose normal.  Eyes:     Extraocular Movements: Extraocular movements intact.     Conjunctiva/sclera: Conjunctivae  normal.     Pupils: Pupils are equal, round, and reactive to light.  Cardiovascular:     Rate and Rhythm: Normal rate and regular rhythm.     Heart sounds: No murmur heard. Pulmonary:     Effort: Pulmonary effort is normal. No respiratory distress.     Breath sounds: Normal breath sounds.  Abdominal:     General: Abdomen is flat. There is no distension.     Palpations: Abdomen is soft. There is no mass.     Tenderness: There is no abdominal tenderness. There is no guarding.  Musculoskeletal:     Cervical back: Normal range of motion and neck supple.     Right lower leg: No edema.     Left lower leg: No edema.  Skin:    General: Skin is warm and dry.  Neurological:     Mental Status: She is alert and  oriented to person, place, and time. Mental status is at baseline.     Cranial Nerves: No cranial nerve deficit.     Sensory: No sensory deficit.     Motor: No weakness.     Coordination: Coordination normal.  Psychiatric:        Mood and Affect: Mood normal.     ED Results / Procedures / Treatments   Labs (all labs ordered are listed, but only abnormal results are displayed) Labs Reviewed  BASIC METABOLIC PANEL - Abnormal; Notable for the following components:      Result Value   Potassium 3.4 (*)    Glucose, Bld 102 (*)    BUN 7 (*)    All other components within normal limits  CBC - Abnormal; Notable for the following components:   Hemoglobin 15.3 (*)    All other components within normal limits    EKG EKG Interpretation Date/Time:  Wednesday April 20 2023 13:47:16 EDT Ventricular Rate:  60 PR Interval:  163 QRS Duration:  86 QT Interval:  422 QTC Calculation: 422 R Axis:   51  Text Interpretation: Sinus rhythm Consider left atrial enlargement Confirmed by Vonita Moss [id number]) on 04/20/2023 1:52:43 PM  Radiology No results found.  Procedures Procedures    Medications Ordered in ED Medications - No data to display  ED Course/ Medical Decision Making/ A&P                                 Medical Decision Making Amount and/or Complexity of Data Reviewed Labs: ordered.  Risk Prescription drug management.   Emily [name] is a 77 y.o. female with comorbidities that complicate the patient evaluation including hypertension who presents to the emergency department with elevated blood pressures.  Initial Ddx:  Hypertensive emergency, asymptomatic hypertension, stroke, MI  MDM/Course:  Patient presented to the emergency department with elevated blood pressure at home for the past few days.  Did also report some lightheadedness but this appears to have resolved.  Not having any symptoms that would be concerning for hypertensive emergency at this point in  time.  Did have an EKG from triage that was reassuring.  Did have some labs drawn which were unremarkable aside from a potassium of 3.4.  Upon re-evaluation patient's blood pressures were quite labile and initially was hypertensive into the 180s but when I was talking to her and she was calm were in the 130s.  Did start to become elevated closer to discharge after she had been walking around.  Given the patient's frequent fluctuations in her blood pressure and the fact that she reports she has been very stressed every morning when she has been taking her blood pressure I counseled her to take her blood pressure at home when she is relaxed and calm and to continue the amlodipine and metoprolol that she has been prescribed by her primary doctor.  Instructed her that if it is elevated for the next 2 days above 140 mmHg systolic to stop the metoprolol and start taking the amlodipine since it is likely to be more effective at reducing her blood pressure.  She will likely need to be started on potassium supplementation if she is to continue this long-term but reports that she will follow-up with her primary doctor on Wednesday of next week.  This patient presents to the ED for concern of complaints listed in HPI, this involves an extensive number of treatment options, and is a complaint that carries with it a high risk of complications and morbidity. Disposition including potential need for admission considered.   Dispo: DC Home. Return precautions discussed including, but not limited to, those listed in the AVS. Allowed pt time to ask questions which were answered fully prior to dc.  Records reviewed Outpatient Clinic Notes The following labs were independently interpreted: Chemistry and show no acute abnormality I personally reviewed and interpreted cardiac monitoring: normal sinus rhythm  I personally reviewed and interpreted the pt's EKG: see above for interpretation  I have reviewed the patients home  medications and made adjustments as needed Social Determinants of health:  Elderly  Final Clinical Impression(s) / ED Diagnoses Final diagnoses:  Secondary hypertension    Rx / DC Orders ED Discharge Orders          Ordered    hydrochlorothiazide (HYDRODIURIL) 25 MG tablet  Daily        04/20/23 1504              Rondel Baton, MD 04/20/23 1547

## 2023-04-20 NOTE — ED Triage Notes (Addendum)
Pt presents for htn (180/100 at home) and transient dizziness/nausea that is not currently present.   Denies CP or SOB  H/o HTN, takes metoprolol and amlodipine

## 2023-04-20 NOTE — Telephone Encounter (Signed)
done

## 2023-04-20 NOTE — Discharge Instructions (Signed)
You were seen for your high blood pressure in the emergency department.   At home, please take your blood pressure every day.  If your blood pressure remains over 140 mmHg for the next 2 days he stopped taking the metoprolol and start taking the hydrochlorothiazide that you have been prescribed.  Please continue to record your blood pressures regardless and discussed this with your primary doctor next week.  Return immediately to the emergency department if you experience any of the following: Severe headaches, chest pain, shortness of breath, or any other concerning symptoms.    Thank you for visiting our Emergency Department. It was a pleasure taking care of you today.

## 2023-04-27 ENCOUNTER — Telehealth: Payer: Self-pay

## 2023-04-27 DIAGNOSIS — F410 Panic disorder [episodic paroxysmal anxiety] without agoraphobia: Secondary | ICD-10-CM | POA: Diagnosis not present

## 2023-04-27 DIAGNOSIS — I10 Essential (primary) hypertension: Secondary | ICD-10-CM | POA: Diagnosis not present

## 2023-04-27 DIAGNOSIS — F419 Anxiety disorder, unspecified: Secondary | ICD-10-CM | POA: Diagnosis not present

## 2023-04-27 NOTE — Telephone Encounter (Signed)
Transition Care Management Unsuccessful Follow-up Telephone Call  Date of discharge and from where:  04/20/2023 [facility]  Attempts:  1st Attempt  Reason for unsuccessful TCM follow-up call:  Left voice message  [name] [name]  [facility] Population Health Community Resource Care Guide   [email or web address]  ?? [phone]   Website: [email or web address]  [email or web address]

## 2023-04-27 NOTE — Telephone Encounter (Signed)
Transition Care Management Unsuccessful Follow-up Telephone Call  Date of discharge and from where:  04/20/2023 [facility]  Attempts:  2nd Attempt  Reason for unsuccessful TCM follow-up call:  Left voice message  Kenwood Rosiak [name]  [facility] Population Health Community Resource Care Guide   [email or web address]  ?? [phone]   Website: [email or web address]  [email or web address]

## 2023-05-18 DIAGNOSIS — Z1231 Encounter for screening mammogram for malignant neoplasm of breast: Secondary | ICD-10-CM | POA: Diagnosis not present

## 2023-06-06 DIAGNOSIS — F419 Anxiety disorder, unspecified: Secondary | ICD-10-CM | POA: Diagnosis not present

## 2023-06-06 DIAGNOSIS — F410 Panic disorder [episodic paroxysmal anxiety] without agoraphobia: Secondary | ICD-10-CM | POA: Diagnosis not present

## 2023-06-06 DIAGNOSIS — Z23 Encounter for immunization: Secondary | ICD-10-CM | POA: Diagnosis not present

## 2023-08-08 DIAGNOSIS — R7303 Prediabetes: Secondary | ICD-10-CM | POA: Diagnosis not present

## 2023-08-08 DIAGNOSIS — M810 Age-related osteoporosis without current pathological fracture: Secondary | ICD-10-CM | POA: Diagnosis not present

## 2023-08-08 DIAGNOSIS — F419 Anxiety disorder, unspecified: Secondary | ICD-10-CM | POA: Diagnosis not present

## 2023-08-08 DIAGNOSIS — N182 Chronic kidney disease, stage 2 (mild): Secondary | ICD-10-CM | POA: Diagnosis not present

## 2023-08-08 DIAGNOSIS — E782 Mixed hyperlipidemia: Secondary | ICD-10-CM | POA: Diagnosis not present

## 2023-08-08 DIAGNOSIS — I10 Essential (primary) hypertension: Secondary | ICD-10-CM | POA: Diagnosis not present

## 2023-08-09 DIAGNOSIS — N764 Abscess of vulva: Secondary | ICD-10-CM | POA: Diagnosis not present

## 2023-08-15 DIAGNOSIS — R7303 Prediabetes: Secondary | ICD-10-CM | POA: Diagnosis not present

## 2023-08-15 DIAGNOSIS — N182 Chronic kidney disease, stage 2 (mild): Secondary | ICD-10-CM | POA: Diagnosis not present

## 2023-08-15 DIAGNOSIS — F419 Anxiety disorder, unspecified: Secondary | ICD-10-CM | POA: Diagnosis not present

## 2023-08-15 DIAGNOSIS — F410 Panic disorder [episodic paroxysmal anxiety] without agoraphobia: Secondary | ICD-10-CM | POA: Diagnosis not present

## 2023-08-15 DIAGNOSIS — E782 Mixed hyperlipidemia: Secondary | ICD-10-CM | POA: Diagnosis not present

## 2023-08-15 DIAGNOSIS — M810 Age-related osteoporosis without current pathological fracture: Secondary | ICD-10-CM | POA: Diagnosis not present

## 2023-08-15 DIAGNOSIS — I10 Essential (primary) hypertension: Secondary | ICD-10-CM | POA: Diagnosis not present

## 2023-10-12 DIAGNOSIS — E782 Mixed hyperlipidemia: Secondary | ICD-10-CM | POA: Diagnosis not present

## 2023-10-12 DIAGNOSIS — N182 Chronic kidney disease, stage 2 (mild): Secondary | ICD-10-CM | POA: Diagnosis not present

## 2023-10-12 DIAGNOSIS — I10 Essential (primary) hypertension: Secondary | ICD-10-CM | POA: Diagnosis not present

## 2023-10-12 DIAGNOSIS — F410 Panic disorder [episodic paroxysmal anxiety] without agoraphobia: Secondary | ICD-10-CM | POA: Diagnosis not present

## 2023-10-12 DIAGNOSIS — R7303 Prediabetes: Secondary | ICD-10-CM | POA: Diagnosis not present

## 2023-10-12 DIAGNOSIS — M810 Age-related osteoporosis without current pathological fracture: Secondary | ICD-10-CM | POA: Diagnosis not present

## 2023-10-12 DIAGNOSIS — F419 Anxiety disorder, unspecified: Secondary | ICD-10-CM | POA: Diagnosis not present

## 2023-10-14 DIAGNOSIS — R059 Cough, unspecified: Secondary | ICD-10-CM | POA: Diagnosis not present

## 2023-10-14 DIAGNOSIS — R509 Fever, unspecified: Secondary | ICD-10-CM | POA: Diagnosis not present

## 2023-12-13 ENCOUNTER — Emergency Department (HOSPITAL_BASED_OUTPATIENT_CLINIC_OR_DEPARTMENT_OTHER)

## 2023-12-13 ENCOUNTER — Other Ambulatory Visit: Payer: Self-pay

## 2023-12-13 ENCOUNTER — Emergency Department (HOSPITAL_BASED_OUTPATIENT_CLINIC_OR_DEPARTMENT_OTHER): Admitting: Radiology

## 2023-12-13 ENCOUNTER — Emergency Department (HOSPITAL_BASED_OUTPATIENT_CLINIC_OR_DEPARTMENT_OTHER)
Admission: EM | Admit: 2023-12-13 | Discharge: 2023-12-13 | Disposition: A | Discharge status: Home / Self Care | Attending: Emergency Medicine | Admitting: Emergency Medicine

## 2023-12-13 DIAGNOSIS — I700 Atherosclerosis of aorta: Secondary | ICD-10-CM | POA: Diagnosis not present

## 2023-12-13 DIAGNOSIS — Z7982 Long term (current) use of aspirin: Secondary | ICD-10-CM | POA: Diagnosis not present

## 2023-12-13 DIAGNOSIS — R519 Headache, unspecified: Secondary | ICD-10-CM | POA: Diagnosis not present

## 2023-12-13 DIAGNOSIS — Z79899 Other long term (current) drug therapy: Secondary | ICD-10-CM | POA: Diagnosis not present

## 2023-12-13 DIAGNOSIS — I1A0 Resistant hypertension: Secondary | ICD-10-CM | POA: Insufficient documentation

## 2023-12-13 DIAGNOSIS — R030 Elevated blood-pressure reading, without diagnosis of hypertension: Secondary | ICD-10-CM | POA: Diagnosis not present

## 2023-12-13 DIAGNOSIS — I10 Essential (primary) hypertension: Secondary | ICD-10-CM | POA: Diagnosis not present

## 2023-12-13 DIAGNOSIS — E876 Hypokalemia: Secondary | ICD-10-CM | POA: Insufficient documentation

## 2023-12-13 DIAGNOSIS — Z8673 Personal history of transient ischemic attack (TIA), and cerebral infarction without residual deficits: Secondary | ICD-10-CM | POA: Insufficient documentation

## 2023-12-13 DIAGNOSIS — R079 Chest pain, unspecified: Secondary | ICD-10-CM | POA: Diagnosis not present

## 2023-12-13 LAB — BASIC METABOLIC PANEL WITH GFR
Anion gap: 10 (ref 5–15)
BUN: 9 mg/dL (ref 8–23)
CO2: 26 mmol/L (ref 22–32)
Calcium: 10 mg/dL (ref 8.9–10.3)
Chloride: 103 mmol/L (ref 98–111)
Creatinine, Ser: 0.79 mg/dL (ref 0.44–1.00)
GFR, Estimated: >60 mL/min (ref >60)
Glucose, Bld: 92 mg/dL (ref 70–99)
Potassium: 3.3 mmol/L — ABNORMAL LOW (ref 3.5–5.1)
Sodium: 139 mmol/L (ref 135–145)

## 2023-12-13 LAB — CBC
HCT: 41.8 % (ref 36.0–46.0)
Hemoglobin: 14.2 g/dL (ref 12.0–15.0)
MCH: 31.1 pg (ref 26.0–34.0)
MCHC: 34 g/dL (ref 30.0–36.0)
MCV: 91.5 fL (ref 80.0–100.0)
Platelets: 248 10*3/uL (ref 150–400)
RBC: 4.57 MIL/uL (ref 3.87–5.11)
RDW: 13.3 % (ref 11.5–15.5)
WBC: 6.7 10*3/uL (ref 4.0–10.5)
nRBC: 0 % (ref 0.0–0.2)

## 2023-12-13 LAB — TROPONIN I (HIGH SENSITIVITY)
Troponin I (High Sensitivity): 2 ng/L (ref <18)
Troponin I (High Sensitivity): 4 ng/L (ref <18)

## 2023-12-13 MED ORDER — AMLODIPINE BESYLATE 5 MG PO TABS
5.0000 mg | ORAL_TABLET | Freq: Once | ORAL | Status: AC
  Administered 2023-12-13: 5 mg via ORAL
  Filled 2023-12-13: qty 1

## 2023-12-13 NOTE — ED Provider Notes (Cosign Needed Addendum)
 [place] EMERGENCY DEPARTMENT AT [facility] Provider Note   CSN: [id number] Arrival date & time: 12/13/23  1309     History  Chief Complaint  Patient presents with   Hypertension    Emily [name] is a 78 y.o. female.  Patient is a 78 year old female resents ED today with complaints of hypertension, stating that she usually has intermittent headache, relieved by Tylenol chest pain, intermittent vertigo but came in today due to the high blood pressure and headaches left arm pain over the brachial region. Previous medical history of headache, GERD, HLD, HTN, TIA.  Currently taking metoprolol and olmesartan, with noting that her PCP recently changed her from amlodipine to olmesartan due to persistent hypertension in December 2024.  Has been faithfully taking the medication with no new medication changes otherwise.  Denies one-sided weakness, vision changes, neck pain, falls, sore throat, cough, congestion, shortness of breath, abdominal pain, vomiting, diarrhea, melena, hematochezia, dysuria, hematuria, lower leg swelling.  The history is provided by the patient.  Hypertension Associated symptoms include headaches.       Home Medications Prior to Admission medications   Medication Sig Start Date End Date Taking? Authorizing Provider  amLODipine (NORVASC) 5 MG tablet Take 5 mg by mouth daily. 07/18/20   [provider]  aspirin EC 81 MG EC tablet Take 1 tablet (81 mg total) by mouth daily. Swallow whole. 08/04/20   Dorcas Carrow, MD  Cholecalciferol (VITAMIN D3) 1.25 MG (50000 UT) TABS Take by mouth daily.    [provider]  esomeprazole (NEXIUM) 40 MG capsule Take 40 mg by mouth daily at 12 noon.    [provider]  hydrochlorothiazide (HYDRODIURIL) 25 MG tablet Take 1 tablet (25 mg total) by mouth daily. 04/20/23   Rondel Baton, MD  metoprolol succinate (TOPROL-XL) 25 MG 24 hr tablet TAKE 1 TABLET BY MOUTH EVERY DAY Patient taking  differently: Take 25 mg by mouth daily.  10/01/19   Toniann Fail, NP  oxyCODONE-acetaminophen (PERCOCET/ROXICET) 5-325 MG tablet Take 1 tablet by mouth every 6 (six) hours as needed for severe pain. 11/18/21   Long, Arlyss Repress, MD  oxyCODONE-acetaminophen (PERCOCET/ROXICET) 5-325 MG tablet Take 1 tablet by mouth every 6 (six) hours as needed for severe pain. 11/18/21   Long, Arlyss Repress, MD  potassium chloride (KLOR-CON) 10 MEQ tablet Take 1 tablet (10 mEq total) by mouth 2 (two) times daily. 01/26/21   Benjiman Core, MD  predniSONE (DELTASONE) 50 MG tablet Take 1 tablet (50 mg total) by mouth daily. 11/18/21   Dione Booze, MD  rosuvastatin (CRESTOR) 20 MG tablet Take 1 tablet by mouth daily. 04/07/19   [provider]      Allergies    Sulfa antibiotics    Review of Systems   Review of Systems  Neurological:  Positive for headaches.  All other systems reviewed and are negative.   Physical Exam Updated Vital Signs BP (!) 170/114 (BP Location: Right Arm)   Pulse 64   Temp 98.1 F (36.7 C) (Oral)   Resp 13   SpO2 100%  Physical Exam Vitals and nursing note reviewed.  Constitutional:      General: She is not in acute distress.    Appearance: Normal appearance. She is not ill-appearing.  HENT:     Head: Normocephalic and atraumatic.     Right Ear: Tympanic membrane, ear canal and external ear normal.     Left Ear: Tympanic membrane, ear canal and external ear normal.  Nose: No congestion.     Mouth/Throat:     Mouth: Mucous membranes are moist.     Pharynx: Oropharynx is clear. No oropharyngeal exudate or posterior oropharyngeal erythema.  Eyes:     General: No scleral icterus.       Right eye: No discharge.        Left eye: No discharge.     Extraocular Movements: Extraocular movements intact.     Conjunctiva/sclera: Conjunctivae normal.     Pupils: Pupils are equal, round, and reactive to light.  Cardiovascular:     Rate and Rhythm: Normal rate and regular  rhythm.     Pulses: Normal pulses.     Heart sounds: Normal heart sounds. No murmur heard.    No friction rub. No gallop.  Pulmonary:     Effort: Pulmonary effort is normal. No respiratory distress.     Breath sounds: Normal breath sounds. No stridor. No wheezing, rhonchi or rales.  Abdominal:     General: Abdomen is flat. There is no distension.     Palpations: Abdomen is soft.     Tenderness: There is no abdominal tenderness. There is no right CVA tenderness, left CVA tenderness or guarding.  Musculoskeletal:        General: No deformity or signs of injury.     Cervical back: Normal range of motion and neck supple. No rigidity.     Right lower leg: No edema.     Left lower leg: No edema.  Skin:    General: Skin is warm and dry.     Findings: No bruising, erythema or lesion.  Neurological:     General: No focal deficit present.     Mental Status: She is alert and oriented to person, place, and time. Mental status is at baseline.     Cranial Nerves: No cranial nerve deficit.     Sensory: No sensory deficit.     Motor: No weakness.     Coordination: Coordination normal.     Gait: Gait normal.  Psychiatric:        Mood and Affect: Mood normal.     ED Results / Procedures / Treatments   Labs (all labs ordered are listed, but only abnormal results are displayed) Labs Reviewed  BASIC METABOLIC PANEL WITH GFR - Abnormal; Notable for the following components:      Result Value   Potassium 3.3 (*)    All other components within normal limits  CBC  TROPONIN I (HIGH SENSITIVITY)  TROPONIN I (HIGH SENSITIVITY)    EKG EKG Interpretation Date/Time:  Tuesday December 13 2023 13:24:50 EDT Ventricular Rate:  80 PR Interval:  154 QRS Duration:  74 QT Interval:  372 QTC Calculation: 429 R Axis:   0  Text Interpretation: Normal sinus rhythm Possible Left atrial enlargement When compared with ECG of 20-Apr-2023 13:47, PREVIOUS ECG IS PRESENT Confirmed by Virgina Norfolk [phone]) on  12/13/2023 1:31:54 PM  Radiology CT Head Wo Contrast Result Date: 12/13/2023 CLINICAL DATA:  Headache EXAM: CT HEAD WITHOUT CONTRAST TECHNIQUE: Contiguous axial images were obtained from the base of the skull through the vertex without intravenous contrast. RADIATION DOSE REDUCTION: This exam was performed according to the departmental dose-optimization program which includes automated exposure control, adjustment of the mA and/or kV according to patient size and/or use of iterative reconstruction technique. COMPARISON:  MRI head 08/03/2020, CT head 08/01/2020. FINDINGS: Brain: No acute intracranial hemorrhage. No CT evidence of acute infarct. No edema, mass effect, or midline shift.  The basilar cisterns are patent. Ventricles: The ventricles are normal. Vascular: Atherosclerotic calcifications of the carotid siphons. No hyperdense vessel. Skull: No acute or aggressive finding. Orbits: Visualized orbits are unremarkable. Sinuses: The visualized paranasal sinuses are clear. Other: Mastoid air cells are clear. IMPRESSION: No CT evidence of acute intracranial abnormality. Electronically Signed   By: Emily Filbert M.D.   On: 12/13/2023 15:35   DG Chest 2 View Result Date: 12/13/2023 CLINICAL DATA:  Chest pain.  Elevated blood pressure. EXAM: CHEST - 2 VIEW COMPARISON:  12/16/2020 FINDINGS: The cardiomediastinal contours are normal. Aortic atherosclerosis. The lungs are clear. Pulmonary vasculature is normal. No consolidation, pleural effusion, or pneumothorax. Thoracic spondylosis. No acute osseous abnormalities are seen. IMPRESSION: No active cardiopulmonary disease. Electronically Signed   By: Narda Rutherford M.D.   On: 12/13/2023 15:32    Procedures Procedures    Medications Ordered in ED Medications  amLODipine (NORVASC) tablet 5 mg (5 mg Oral Given 12/13/23 1520)    ED Course/ Medical Decision Making/ A&P            HEART Score: 3                Geneva (Revised) Score: 1, Geneva Score  Interpretation: Low Risk Group: 7-9% incidence of pulmonary embolism from several studies   Medical Decision Making Amount and/or Complexity of Data Reviewed Labs: ordered. Radiology: ordered.  Risk Prescription drug management.   This patient is a 78 year old female who presents to the ED for concern of hypertension with intermittent headache, vertigo.  The headache and the vertigo appear to be more chronic.  The high blood pressure today seems likely due resistant hypertension.  As patient is currently asymptomatic at this time.  On physical exam, patient is in no acute distress, afebrile, alert and orient x 4, speaking in full sentences, nontachypneic, nontachycardic. noted to have a completely normal neuroexam.  No facial asymmetry, no ataxia, no apraxia, no aphasia, no arm drift, normal coordination with finger-to-nose, normal sensation to both upper and lower extremities bilaterally, normal grip strength bilaterally, normal strength to both flexion and extension to both upper lower extremities 5+ bilaterally, no visual field deficits, no nystagmus.  Atypical chest pain workup was ordered in triage, noted to be negative.  CT head was also noted to be negative.  No signs of endorgan damage from high blood pressure.  Patient was given amlodipine and on reevaluation blood pressure had improved.  Will recommend that she continues to take amlodipine on top of the olmesartan and the metoprolol that she is currently taking.  And to follow-up with PCP for further evaluation for resistant hypertension.  Will also have her continue to take log of her blood pressures to take to her PCP.  Patient is wishing to leave at this time.  Patient vital signs have remained stable throughout the course of patient's time in the ED. Low suspicion for any other emergent pathology at this time. I believe this patient is safe to be discharged. Provided strict return to ER precautions. Patient expressed agreement and  understanding of plan. All questions were answered.  Differential diagnoses prior to evaluation: The emergent differential diagnosis includes, but is not limited to, hypertensive urgency versus emergency, ACS, encephalopathy, CVA, pneumonia. This is not an exhaustive differential.   Past Medical History / Co-morbidities / Social History: headache, GERD, HLD, HTN, TIA  Additional history: Chart reviewed. Pertinent results include: Last seen by PCP on 10/12/2023  Last seen in the ED on 04/20/2023 for  secondary hypertension noted to have been taking metoprolol and switching to amlodipine a systolic is above 140  Lab Tests/Imaging studies: I personally interpreted labs/imaging and the pertinent results include:   CBC unremarkable BMP shows a mild hypokalemia 3.3 Troponin unremarkable CT head unremarkable Chest x-ray unremarkable  I agree with the radiologist interpretation.  Cardiac monitoring: EKG obtained and interpreted by myself and attending physician which shows: NSR   EKG Interpretation Date/Time:  Tuesday December 13 2023 13:24:50 EDT Ventricular Rate:  80 PR Interval:  154 QRS Duration:  74 QT Interval:  372 QTC Calculation: 429 R Axis:   0  Text Interpretation: Normal sinus rhythm Possible Left atrial enlargement When compared with ECG of 20-Apr-2023 13:47, PREVIOUS ECG IS PRESENT Confirmed by Virgina Norfolk [phone]) on 12/13/2023 1:31:54 PM       Medications: I ordered medication including amlodipine.  I have reviewed the patients home medicines and have made adjustments as needed.  Critical Interventions:  Social Determinants of Health:  Disposition: After consideration of the diagnostic results and the patients response to treatment, I feel that the patient would benefit from discharge and treatment as above.   emergency department workup does not suggest an emergent condition requiring admission or immediate intervention beyond what has been performed at this time.  The plan is: Follow-up with PCP, add amlodipine to hypertension regimen if continued to be elevated, return for any new or worsening symptoms. The patient is safe for discharge and has been instructed to return immediately for worsening symptoms, change in symptoms or any other concerns.  Final Clinical Impression(s) / ED Diagnoses Final diagnoses:  Resistant hypertension    Rx / DC Orders ED Discharge Orders     None         Lunette Stands, PA-C 12/13/23 1709    Lunette Stands, PA-C 12/13/23 1709    Virgina Norfolk, DO 12/14/23 [phone]

## 2023-12-13 NOTE — ED Triage Notes (Signed)
 HTN 190's in triage. Can feel it is high- HA and left arm pain. Denies SOB, N/V/D. Takes HTN medications as prescribed- no changes recently.

## 2023-12-13 NOTE — Discharge Instructions (Addendum)
 You were seen today for hypertension, headache.  Your lab work and imaging were all reassuring that no endorgan damage is present at this time.  Recommend that you continue to take the amlodipine as an additional blood pressure medication until you are able to follow-up with your PCP or until your blood pressures resolve.  Recommend continue to monitor symptoms at home as well as to take a log for which you can be able to take to your PCP when you meet with him to ensure medications are treating blood pressure properly.  Return to ED for any new or worsening symptoms including headache with visual changes, decreased urination, confusion, chest pain, shortness of breath.

## 2023-12-28 DIAGNOSIS — F39 Unspecified mood [affective] disorder: Secondary | ICD-10-CM | POA: Diagnosis not present

## 2023-12-28 DIAGNOSIS — I10 Essential (primary) hypertension: Secondary | ICD-10-CM | POA: Diagnosis not present

## 2024-02-22 DIAGNOSIS — I10 Essential (primary) hypertension: Secondary | ICD-10-CM | POA: Diagnosis not present

## 2024-02-22 DIAGNOSIS — R5383 Other fatigue: Secondary | ICD-10-CM | POA: Diagnosis not present

## 2024-02-22 DIAGNOSIS — E782 Mixed hyperlipidemia: Secondary | ICD-10-CM | POA: Diagnosis not present

## 2024-02-22 DIAGNOSIS — N182 Chronic kidney disease, stage 2 (mild): Secondary | ICD-10-CM | POA: Diagnosis not present

## 2024-02-22 DIAGNOSIS — R7303 Prediabetes: Secondary | ICD-10-CM | POA: Diagnosis not present

## 2024-02-22 DIAGNOSIS — M810 Age-related osteoporosis without current pathological fracture: Secondary | ICD-10-CM | POA: Diagnosis not present

## 2024-04-05 DIAGNOSIS — E782 Mixed hyperlipidemia: Secondary | ICD-10-CM | POA: Diagnosis not present

## 2024-04-05 DIAGNOSIS — Z0000 Encounter for general adult medical examination without abnormal findings: Secondary | ICD-10-CM | POA: Diagnosis not present

## 2024-04-05 DIAGNOSIS — F419 Anxiety disorder, unspecified: Secondary | ICD-10-CM | POA: Diagnosis not present

## 2024-04-05 DIAGNOSIS — M810 Age-related osteoporosis without current pathological fracture: Secondary | ICD-10-CM | POA: Diagnosis not present

## 2024-04-05 DIAGNOSIS — R7303 Prediabetes: Secondary | ICD-10-CM | POA: Diagnosis not present

## 2024-04-05 DIAGNOSIS — F410 Panic disorder [episodic paroxysmal anxiety] without agoraphobia: Secondary | ICD-10-CM | POA: Diagnosis not present

## 2024-04-05 DIAGNOSIS — N182 Chronic kidney disease, stage 2 (mild): Secondary | ICD-10-CM | POA: Diagnosis not present

## 2024-04-05 DIAGNOSIS — I10 Essential (primary) hypertension: Secondary | ICD-10-CM | POA: Diagnosis not present

## 2024-06-11 DIAGNOSIS — Z1231 Encounter for screening mammogram for malignant neoplasm of breast: Secondary | ICD-10-CM | POA: Diagnosis not present

## 2024-06-21 DIAGNOSIS — Z23 Encounter for immunization: Secondary | ICD-10-CM | POA: Diagnosis not present
